# Patient Record
Sex: Male | Born: 1960 | Race: White | Hispanic: No | Marital: Single | Smoking: Current every day smoker
Health system: Southern US, Community
[De-identification: ages and names within clinical notes are randomized; demographics above are authoritative.]

## PROBLEM LIST (undated history)

## (undated) DIAGNOSIS — Z720 Tobacco use: Secondary | ICD-10-CM

## (undated) DIAGNOSIS — F1010 Alcohol abuse, uncomplicated: Secondary | ICD-10-CM

## (undated) DIAGNOSIS — T50901A Poisoning by unspecified drugs, medicaments and biological substances, accidental (unintentional), initial encounter: Secondary | ICD-10-CM

## (undated) DIAGNOSIS — I10 Essential (primary) hypertension: Secondary | ICD-10-CM

## (undated) DIAGNOSIS — J45909 Unspecified asthma, uncomplicated: Secondary | ICD-10-CM

## (undated) DIAGNOSIS — B20 Human immunodeficiency virus [HIV] disease: Secondary | ICD-10-CM

## (undated) DIAGNOSIS — K852 Alcohol induced acute pancreatitis without necrosis or infection: Secondary | ICD-10-CM

## (undated) DIAGNOSIS — K2970 Gastritis, unspecified, without bleeding: Secondary | ICD-10-CM

## (undated) DIAGNOSIS — Z21 Asymptomatic human immunodeficiency virus [HIV] infection status: Secondary | ICD-10-CM

## (undated) HISTORY — PX: CHOLECYSTECTOMY: SHX55

## (undated) HISTORY — PX: CERVICAL DISC SURGERY: SHX588

## (undated) HISTORY — PX: FRENULECTOMY, UPPER LABIAL: SHX1683

## (undated) HISTORY — PX: APPENDECTOMY: SHX54

---

## 2002-02-14 ENCOUNTER — Encounter: Payer: Self-pay | Admitting: Internal Medicine

## 2002-02-14 ENCOUNTER — Inpatient Hospital Stay (HOSPITAL_COMMUNITY): Admission: EM | Admit: 2002-02-14 | Discharge: 2002-02-15 | Payer: Self-pay | Admitting: *Deleted

## 2013-11-23 ENCOUNTER — Inpatient Hospital Stay (HOSPITAL_COMMUNITY)
Admission: EM | Admit: 2013-11-23 | Discharge: 2013-11-25 | DRG: 440 | Disposition: A | Discharge status: Home / Self Care | Payer: Self-pay | Attending: Internal Medicine | Admitting: Internal Medicine

## 2013-11-23 ENCOUNTER — Encounter (HOSPITAL_COMMUNITY): Payer: Self-pay | Admitting: Emergency Medicine

## 2013-11-23 DIAGNOSIS — K859 Acute pancreatitis without necrosis or infection, unspecified: Principal | ICD-10-CM | POA: Diagnosis present

## 2013-11-23 DIAGNOSIS — E876 Hypokalemia: Secondary | ICD-10-CM | POA: Diagnosis present

## 2013-11-23 DIAGNOSIS — Z23 Encounter for immunization: Secondary | ICD-10-CM

## 2013-11-23 DIAGNOSIS — K2920 Alcoholic gastritis without bleeding: Secondary | ICD-10-CM | POA: Diagnosis present

## 2013-11-23 DIAGNOSIS — K861 Other chronic pancreatitis: Secondary | ICD-10-CM | POA: Diagnosis present

## 2013-11-23 DIAGNOSIS — R109 Unspecified abdominal pain: Secondary | ICD-10-CM | POA: Diagnosis present

## 2013-11-23 DIAGNOSIS — Z21 Asymptomatic human immunodeficiency virus [HIV] infection status: Secondary | ICD-10-CM | POA: Diagnosis present

## 2013-11-23 DIAGNOSIS — F1010 Alcohol abuse, uncomplicated: Secondary | ICD-10-CM | POA: Diagnosis present

## 2013-11-23 DIAGNOSIS — B20 Human immunodeficiency virus [HIV] disease: Secondary | ICD-10-CM | POA: Diagnosis present

## 2013-11-23 DIAGNOSIS — F1020 Alcohol dependence, uncomplicated: Secondary | ICD-10-CM | POA: Diagnosis present

## 2013-11-23 DIAGNOSIS — F17200 Nicotine dependence, unspecified, uncomplicated: Secondary | ICD-10-CM | POA: Diagnosis present

## 2013-11-23 DIAGNOSIS — R4182 Altered mental status, unspecified: Secondary | ICD-10-CM

## 2013-11-23 HISTORY — DX: Alcohol induced acute pancreatitis without necrosis or infection: K85.20

## 2013-11-23 HISTORY — DX: Human immunodeficiency virus (HIV) disease: B20

## 2013-11-23 HISTORY — DX: Alcohol abuse, uncomplicated: F10.10

## 2013-11-23 HISTORY — DX: Asymptomatic human immunodeficiency virus (hiv) infection status: Z21

## 2013-11-23 LAB — CBC WITH DIFFERENTIAL/PLATELET
Basophils Absolute: 0.1 10*3/uL (ref 0.0–0.1)
Basophils Relative: 1 % (ref 0–1)
Eosinophils Absolute: 0.2 10*3/uL (ref 0.0–0.7)
Eosinophils Relative: 4 % (ref 0–5)
HCT: 47.3 % (ref 39.0–52.0)
Hemoglobin: 16.8 g/dL (ref 13.0–17.0)
Lymphocytes Relative: 42 % (ref 12–46)
Lymphs Abs: 2.7 10*3/uL (ref 0.7–4.0)
MCH: 33.3 pg (ref 26.0–34.0)
MCHC: 35.5 g/dL (ref 30.0–36.0)
MCV: 93.7 fL (ref 78.0–100.0)
Monocytes Absolute: 0.7 10*3/uL (ref 0.1–1.0)
Monocytes Relative: 11 % (ref 3–12)
Neutro Abs: 2.7 10*3/uL (ref 1.7–7.7)
Neutrophils Relative %: 42 % — ABNORMAL LOW (ref 43–77)
Platelets: 231 10*3/uL (ref 150–400)
RBC: 5.05 MIL/uL (ref 4.22–5.81)
RDW: 14.8 % (ref 11.5–15.5)
WBC: 6.3 10*3/uL (ref 4.0–10.5)

## 2013-11-23 LAB — COMPREHENSIVE METABOLIC PANEL
ALT: 50 U/L (ref 0–53)
AST: 66 U/L — ABNORMAL HIGH (ref 0–37)
Albumin: 3.9 g/dL (ref 3.5–5.2)
Alkaline Phosphatase: 117 U/L (ref 39–117)
BUN: 5 mg/dL — ABNORMAL LOW (ref 6–23)
CO2: 24 mEq/L (ref 19–32)
Calcium: 8.7 mg/dL (ref 8.4–10.5)
Chloride: 104 mEq/L (ref 96–112)
Creatinine, Ser: 0.67 mg/dL (ref 0.50–1.35)
GFR calc Af Amer: >90 mL/min (ref >90)
GFR calc non Af Amer: >90 mL/min (ref >90)
Glucose, Bld: 98 mg/dL (ref 70–99)
Potassium: 3.4 mEq/L — ABNORMAL LOW (ref 3.5–5.1)
Sodium: 143 mEq/L (ref 135–145)
Total Bilirubin: 0.6 mg/dL (ref 0.3–1.2)
Total Protein: 8.3 g/dL (ref 6.0–8.3)

## 2013-11-23 LAB — LIPASE, BLOOD: Lipase: 95 U/L — ABNORMAL HIGH (ref 11–59)

## 2013-11-23 LAB — ETHANOL: Alcohol, Ethyl (B): 346 mg/dL — ABNORMAL HIGH (ref 0–11)

## 2013-11-23 MED ORDER — ONDANSETRON HCL 4 MG/2ML IJ SOLN
4.0000 mg | Freq: Once | INTRAMUSCULAR | Status: AC
  Administered 2013-11-23: 4 mg via INTRAVENOUS
  Filled 2013-11-23: qty 2

## 2013-11-23 MED ORDER — SODIUM CHLORIDE 0.9 % IV BOLUS (SEPSIS)
1000.0000 mL | Freq: Once | INTRAVENOUS | Status: AC
  Administered 2013-11-23: 1000 mL via INTRAVENOUS

## 2013-11-23 MED ORDER — MORPHINE SULFATE 4 MG/ML IJ SOLN
4.0000 mg | Freq: Once | INTRAMUSCULAR | Status: AC
  Administered 2013-11-23: 4 mg via INTRAVENOUS
  Filled 2013-11-23: qty 1
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
## 2013-11-23 NOTE — ED Notes (Signed)
Pt. arrived with [employer] , family called [employer] to assist in transporting pt. To ER due to ETOH intoxication , respiration unlabored at arrival but unable to answer triage nurse due to alcohol intoxication .

## 2013-11-23 NOTE — ED Notes (Signed)
Two IV attempts made on pt, One in left ac, on in right upper arm.

## 2013-11-23 NOTE — ED Provider Notes (Signed)
MSE was initiated and I personally evaluated the patient and placed orders (if any) at  10:34 PM on November 23, 2013.   Patient given pain medication and Zofran for abdominal pain and nausea. Lipase added to labs.  The patient appears stable so that the remainder of the MSE may be completed by another provider.  Shon Baton, MD 11/23/13 2235

## 2013-11-23 NOTE — ED Notes (Signed)
Pt began cussing and was very agitated with his friend.    Pt's friend Sheppard Coil asked to wait in the waiting room at this time.

## 2013-11-23 NOTE — ED Notes (Addendum)
Pt BS noted to be 102 in Triage.

## 2013-11-23 NOTE — ED Notes (Signed)
Pt. given a urinal but refused assistance of triage nurse , family at bedside .

## 2013-11-23 NOTE — ED Notes (Signed)
Pillow given to pt. for comfort , nurse explained delay / wait time  and process to pt. And family .

## 2013-11-23 NOTE — ED Notes (Signed)
[employer] officer came to speak with pt. and explained that he is getting a bed as soon as possible  , pt. became verbally abusive to staff earlier despite repeated explanation .

## 2013-11-24 ENCOUNTER — Encounter (HOSPITAL_COMMUNITY): Payer: Self-pay | Admitting: Internal Medicine

## 2013-11-24 ENCOUNTER — Emergency Department (HOSPITAL_COMMUNITY): Payer: Self-pay

## 2013-11-24 DIAGNOSIS — Z21 Asymptomatic human immunodeficiency virus [HIV] infection status: Secondary | ICD-10-CM

## 2013-11-24 DIAGNOSIS — R109 Unspecified abdominal pain: Secondary | ICD-10-CM | POA: Diagnosis present

## 2013-11-24 DIAGNOSIS — B20 Human immunodeficiency virus [HIV] disease: Secondary | ICD-10-CM | POA: Diagnosis present

## 2013-11-24 DIAGNOSIS — K861 Other chronic pancreatitis: Secondary | ICD-10-CM | POA: Diagnosis present

## 2013-11-24 DIAGNOSIS — R4182 Altered mental status, unspecified: Secondary | ICD-10-CM

## 2013-11-24 DIAGNOSIS — F1010 Alcohol abuse, uncomplicated: Secondary | ICD-10-CM | POA: Diagnosis present

## 2013-11-24 LAB — LIPID PANEL
Cholesterol: 173 mg/dL (ref 0–200)
LDL Cholesterol: 97 mg/dL (ref 0–99)
Total CHOL/HDL Ratio: 3.1 RATIO
VLDL: 21 mg/dL (ref 0–40)

## 2013-11-24 LAB — RAPID URINE DRUG SCREEN, HOSP PERFORMED
Barbiturates: NOT DETECTED
Benzodiazepines: NOT DETECTED
Cocaine: NOT DETECTED
Opiates: POSITIVE — AB

## 2013-11-24 MED ORDER — SODIUM CHLORIDE 0.9 % IV SOLN
INTRAVENOUS | Status: DC
  Administered 2013-11-24: 22:00:00 via INTRAVENOUS

## 2013-11-24 MED ORDER — HYDROMORPHONE HCL PF 1 MG/ML IJ SOLN
1.0000 mg | Freq: Once | INTRAMUSCULAR | Status: AC
  Administered 2013-11-24: 1 mg via INTRAVENOUS
  Filled 2013-11-24: qty 1

## 2013-11-24 MED ORDER — HYDROMORPHONE HCL PF 1 MG/ML IJ SOLN: 1.0000 mg | INTRAMUSCULAR | Status: DC | PRN

## 2013-11-24 MED ORDER — NICOTINE 21 MG/24HR TD PT24
21.0000 mg | MEDICATED_PATCH | Freq: Every day | TRANSDERMAL | Status: DC
  Administered 2013-11-24 – 2013-11-25 (×2): 21 mg via TRANSDERMAL
  Filled 2013-11-24 (×3): qty 1

## 2013-11-24 MED ORDER — HYDROMORPHONE HCL PF 1 MG/ML IJ SOLN
1.0000 mg | INTRAMUSCULAR | Status: DC | PRN
  Administered 2013-11-24: 1 mg via INTRAVENOUS
  Filled 2013-11-24 (×2): qty 1

## 2013-11-24 MED ORDER — LORAZEPAM 2 MG/ML IJ SOLN
1.0000 mg | Freq: Once | INTRAMUSCULAR | Status: AC
  Administered 2013-11-24: 1 mg via INTRAVENOUS
  Filled 2013-11-24: qty 1

## 2013-11-24 MED ORDER — LORAZEPAM 2 MG/ML IJ SOLN
2.0000 mg | Freq: Once | INTRAMUSCULAR | Status: AC
  Administered 2013-11-24: 2 mg via INTRAVENOUS
  Filled 2013-11-24: qty 1

## 2013-11-24 MED ORDER — SODIUM CHLORIDE 0.9 % IJ SOLN
3.0000 mL | Freq: Two times a day (BID) | INTRAMUSCULAR | Status: DC
  Administered 2013-11-25: 3 mL via INTRAVENOUS

## 2013-11-24 MED ORDER — FOLIC ACID 1 MG PO TABS
1.0000 mg | ORAL_TABLET | Freq: Every day | ORAL | Status: DC
  Administered 2013-11-24 – 2013-11-25 (×2): 1 mg via ORAL
  Filled 2013-11-24 (×2): qty 1

## 2013-11-24 MED ORDER — THIAMINE HCL 100 MG/ML IJ SOLN
Freq: Once | INTRAVENOUS | Status: AC
  Administered 2013-11-24: 09:00:00 via INTRAVENOUS
  Filled 2013-11-24: qty 1000

## 2013-11-24 MED ORDER — LORAZEPAM 2 MG/ML IJ SOLN
1.0000 mg | Freq: Four times a day (QID) | INTRAMUSCULAR | Status: DC | PRN
  Filled 2013-11-24: qty 1

## 2013-11-24 MED ORDER — PNEUMOCOCCAL VAC POLYVALENT 25 MCG/0.5ML IJ INJ
0.5000 mL | INJECTION | INTRAMUSCULAR | Status: AC
  Administered 2013-11-25: 0.5 mL via INTRAMUSCULAR
  Filled 2013-11-24: qty 0.5

## 2013-11-24 MED ORDER — PROMETHAZINE HCL 25 MG/ML IJ SOLN
25.0000 mg | Freq: Once | INTRAMUSCULAR | Status: AC
  Administered 2013-11-24: 25 mg via INTRAMUSCULAR
  Filled 2013-11-24: qty 1

## 2013-11-24 MED ORDER — THIAMINE HCL 100 MG/ML IJ SOLN
100.0000 mg | Freq: Every day | INTRAMUSCULAR | Status: DC
  Filled 2013-11-24 (×2): qty 1

## 2013-11-24 MED ORDER — HYDROMORPHONE HCL PF 1 MG/ML IJ SOLN
1.0000 mg | INTRAMUSCULAR | Status: DC | PRN
  Administered 2013-11-24 – 2013-11-25 (×4): 1 mg via INTRAVENOUS
  Filled 2013-11-24 (×4): qty 1

## 2013-11-24 MED ORDER — ADULT MULTIVITAMIN W/MINERALS CH
1.0000 | ORAL_TABLET | Freq: Every day | ORAL | Status: DC
  Administered 2013-11-24 – 2013-11-25 (×2): 1 via ORAL
  Filled 2013-11-24 (×2): qty 1

## 2013-11-24 MED ORDER — ONDANSETRON HCL 4 MG PO TABS
4.0000 mg | ORAL_TABLET | Freq: Four times a day (QID) | ORAL | Status: DC | PRN
  Filled 2013-11-24: qty 1

## 2013-11-24 MED ORDER — LORAZEPAM 0.5 MG PO TABS
1.0000 mg | ORAL_TABLET | Freq: Four times a day (QID) | ORAL | Status: DC | PRN
  Administered 2013-11-24 – 2013-11-25 (×4): 1 mg via ORAL
  Filled 2013-11-24: qty 2
  Filled 2013-11-24: qty 1
  Filled 2013-11-24 (×2): qty 2

## 2013-11-24 MED ORDER — ONDANSETRON HCL 4 MG/2ML IJ SOLN
4.0000 mg | Freq: Four times a day (QID) | INTRAMUSCULAR | Status: DC | PRN
  Administered 2013-11-24: 4 mg via INTRAVENOUS
  Filled 2013-11-24: qty 2

## 2013-11-24 MED ORDER — LORAZEPAM 2 MG/ML IJ SOLN: 0.0000 mg | Freq: Two times a day (BID) | INTRAMUSCULAR | Status: DC

## 2013-11-24 MED ORDER — ACETAMINOPHEN 325 MG PO TABS
650.0000 mg | ORAL_TABLET | Freq: Four times a day (QID) | ORAL | Status: DC | PRN
  Administered 2013-11-24: 650 mg via ORAL
  Filled 2013-11-24: qty 2

## 2013-11-24 MED ORDER — INFLUENZA VAC SPLIT QUAD 0.5 ML IM SUSP
0.5000 mL | INTRAMUSCULAR | Status: AC
  Administered 2013-11-25: 0.5 mL via INTRAMUSCULAR
  Filled 2013-11-24: qty 0.5

## 2013-11-24 MED ORDER — ONDANSETRON HCL 4 MG/2ML IJ SOLN: 4.0000 mg | Freq: Three times a day (TID) | INTRAMUSCULAR | Status: DC | PRN

## 2013-11-24 MED ORDER — ACETAMINOPHEN 650 MG RE SUPP: 650.0000 mg | Freq: Four times a day (QID) | RECTAL | Status: DC | PRN

## 2013-11-24 MED ORDER — SODIUM CHLORIDE 0.9 % IV SOLN: INTRAVENOUS | Status: DC

## 2013-11-24 MED ORDER — LORAZEPAM 2 MG/ML IJ SOLN
0.0000 mg | Freq: Four times a day (QID) | INTRAMUSCULAR | Status: DC
  Administered 2013-11-24 (×2): 2 mg via INTRAVENOUS
  Filled 2013-11-24 (×3): qty 1

## 2013-11-24 MED ORDER — VITAMIN B-1 100 MG PO TABS
100.0000 mg | ORAL_TABLET | Freq: Every day | ORAL | Status: DC
  Administered 2013-11-24 – 2013-11-25 (×2): 100 mg via ORAL
  Filled 2013-11-24 (×2): qty 1

## 2013-11-24 MED ORDER — PROMETHAZINE HCL 25 MG/ML IJ SOLN
12.5000 mg | Freq: Four times a day (QID) | INTRAMUSCULAR | Status: DC | PRN
  Administered 2013-11-24: 25 mg via INTRAVENOUS
  Administered 2013-11-24: 12.5 mg via INTRAVENOUS
  Administered 2013-11-25: 25 mg via INTRAVENOUS
  Filled 2013-11-24 (×5): qty 1

## 2013-11-24 MED ORDER — ENOXAPARIN SODIUM 40 MG/0.4ML ~~LOC~~ SOLN
40.0000 mg | SUBCUTANEOUS | Status: DC
  Administered 2013-11-24: 40 mg via SUBCUTANEOUS
  Filled 2013-11-24 (×2): qty 0.4

## 2013-11-24 NOTE — ED Notes (Signed)
Spoke with Dr Arthor Captain and notified patient of no IV access and if possible inserting a central line per IV team because PICC Nurse will not be able to insert PICC line today. Also if pain medication could be changed to IM and order phenergan instead of zofran. Stated will call back.

## 2013-11-24 NOTE — ED Notes (Signed)
Gave pt a urinal 

## 2013-11-24 NOTE — ED Notes (Signed)
Patient transported to X-ray 

## 2013-11-24 NOTE — H&P (Signed)
Patient's PCP: No primary provider on file.  Chief Complaint: Abdominal pain  History of Present Illness: Jermaine Deleon is a 52 y.o. Caucasian male with history of alcohol abuse, chronic pancreatitis, and HIV not on any medications who presents with the above complaints.  Patient is a poor historian, keeps stating that he has abdominal pain and "we are not helping him."  Patient reports that he recently lost his job and he started drinking alcohol on 11/18/2013, prior to that he has been sober for one year.  Initially he was sent to [facility], a tele-psychiatry obtained on 11/22/2013 recommended involuntary commitment for stabilization and safety.  Uncertain what happened afterwards but patient was eventually discharged.  He was brought to there is a department with complaints of abdominal pain.  He was noted at times that patient was belligerent and abusive about not having his pain under control.  Hospitalist service was asked to admit the patient for further care and management.  Review of Systems: Limited to only abdominal pain.  Denies any chest pain or shortness of breath.  Past Medical History  Diagnosis Date  . Alcohol abuse   . HIV (human immunodeficiency virus infection)    History reviewed. No pertinent past surgical history. Family History  Problem Relation Age of Onset  . COPD Mother   . Other Father     From being shot.   History   Social History  . Marital Status: Single    Spouse Name: N/A    Number of Children: N/A  . Years of Education: N/A   Occupational History  . Not on file.   Social History Main Topics  . Smoking status: Current Every Day Smoker  . Smokeless tobacco: Not on file  . Alcohol Use: Yes  . Drug Use: Not on file  . Sexual Activity: Not on file   Other Topics Concern  . Not on file   Social History Narrative  . No narrative on file   Allergies: Review of patient's allergies indicates no known allergies.  Home Meds: Prior to  Admission medications   Not on File    Physical Exam: Blood pressure 127/79, pulse 95, resp. rate 14, SpO2 96.00%. General: Awake, Oriented x3, in some distress from abdominal pain. HEENT: EOMI, Moist mucous membranes Neck: Supple CV: S1 and S2 Lungs: Clear to ascultation bilaterally Abdomen: Soft, Nontender, Nondistended, +bowel sounds. Ext: Good pulses. Trace edema. No clubbing or cyanosis noted. Neuro: Cranial Nerves II-XII grossly intact. Has 5/5 motor strength in upper and lower extremities.  Lab results:  Recent Labs  11/23/13 2110  NA 143  K 3.4*  CL 104  CO2 24  GLUCOSE 98  BUN 5*  CREATININE 0.67  CALCIUM 8.7    Recent Labs  11/23/13 2110  AST 66*  ALT 50  ALKPHOS 117  BILITOT 0.6  PROT 8.3  ALBUMIN 3.9    Recent Labs  11/23/13 2235  LIPASE 95*    Recent Labs  11/23/13 2110  WBC 6.3  NEUTROABS 2.7  HGB 16.8  HCT 47.3  MCV 93.7  PLT 231   No results found for this basename: CKTOTAL, CKMB, CKMBINDEX, TROPONINI,  in the last 72 hours No components found with this basename: POCBNP,  No results found for this basename: DDIMER,  in the last 72 hours No results found for this basename: HGBA1C,  in the last 72 hours No results found for this basename: CHOL, HDL, LDLCALC, TRIG, CHOLHDL, LDLDIRECT,  in the last 72  hours No results found for this basename: TSH, T4TOTAL, FREET3, T3FREE, THYROIDAB,  in the last 72 hours No results found for this basename: VITAMINB12, FOLATE, FERRITIN, TIBC, IRON, RETICCTPCT,  in the last 72 hours Imaging results:  Dg Abd Acute W/chest  11/24/2013   CLINICAL DATA:  Acute back and abdominal pain. History of pancreatitis.  EXAM: ACUTE ABDOMEN SERIES (ABDOMEN 2 VIEW & CHEST 1 VIEW)  COMPARISON:  Acute abdominal series October 10, 2013  FINDINGS: Cardiac silhouette is unremarkable. Mildly tortuous aorta. Similar coarsened interstitium in the lung bases with strandy densities in right lung base. No pleural effusions or focal  consolidations. No pneumothorax. ACDF.  Bowel gas pattern is nondilated and nonobstructive. Surgical clips in the right abdomen likely reflect cholecystectomy. Phleboliths in the pelvis. No intra-abdominal mass effect, pathologic calcifications or free air. Soft tissue planes and included osseous structures are nonsuspicious.  IMPRESSION: No acute cardiopulmonary process; bibasilar interstitial prominence could reflect fibrosis with right lung base atelectasis versus scarring.  Nonspecific bowel gas pattern.   Electronically Signed   By: Awilda Metro   On: 11/24/2013 00:31   Assessment & Plan by Problem: Abdominal pain likely due to acute on chronic pancreatitis Supportive management with n.p.o. except medications and ice chips.  Hydrate the patient on IV fluids.  Control with pain medications.  Check lipid panel in the morning to evaluate for any hypertriglyceridemia.  Suspect acute exacerbation of pancreatitis likely related to recent alcohol abuse.  Alcohol abuse Counseled the patient on alcohol cessation.  Uncertain if patient is going through withdrawal as he is tachycardic and confused at times, blood alcohol level was elevated on admission, doubt patient is withdrawing from alcohol already.  Patient seemed oriented during my interview with the patient.  Started the patient on CIWA, with scheduled and as needed Ativan.  With thiamine and folic acid.  Due to patient being a difficult patient as he has had episodes of confusion with being belligerent towards staff, plan for stepdown admission for now as he may need higher level of care with continued withdrawal.  Requested psychiatry consult for further management.  Patient currently denies feeling depressed, suicidal or homicidal.  Tobacco abuse Counseled the patient on smoking cessation.  Nicotine patch prescribed.  HIV Not on any anti-retroviral therapy.  Will need to establish with infectious diseases clinic after  discharge.  Hypokalemia Replace as needed.  Prophylaxis Lovenox.  CODE STATUS Full code.  Disposition Admit the patient to stepdown as inpatient.  Time spent on admission, talking to the patient, and coordinating care was: 50 mins.  Sie Formisano A, MD 11/24/2013, 6:25 AM

## 2013-11-24 NOTE — ED Notes (Signed)
Dr Arthor Captain called back will placed central line if ED Doctor unable to place IV via ultrasound.

## 2013-11-24 NOTE — ED Notes (Signed)
Pt is yelling and cursing - Dr. Hyacinth Meeker at bedside and spoke w/ pt regarding appropriate behavior and requested pt cease yelling, cursing and clenching his fists towards staff. Pt compliant w/ Dr. Garen Lah requests at this time -

## 2013-11-24 NOTE — Progress Notes (Signed)
Pt becoming increasingly confused after medication administration.  Pt attempting to get OOB, inquiring about car, calling out for friends.  Attempted to reorient patient to surroundings and circumstances.  Bed alarm in place r/t fall risk. Nolia Tschantz, [facility]

## 2013-11-24 NOTE — ED Notes (Signed)
Went into room and pt was standing up and had pulled the call bell out of the wall and was trying to find somewhere to plug it back into. Pt then opened the drawer up in the room and stated his liquor bottle was in the drawer, and trying to look on the ground for his lighter. Pt was becoming very paranoid and said there were people outside his door laughing and talking about him.

## 2013-11-24 NOTE — ED Notes (Signed)
IV team multiple attempts with ultrasound. Unable to and stated PICC Nurse has scheduled full for today. Paged Admit Doctor.

## 2013-11-24 NOTE — ED Notes (Signed)
IV team at bedside 

## 2013-11-24 NOTE — ED Notes (Signed)
Paged IV team and spoke with them for IV access.

## 2013-11-24 NOTE — ED Notes (Signed)
Pt reports upper abd pain and lower back pain - pt is belligerent on arrival to department. Pt admits to hx of pancreatitis and has been having heavy alcohol use recently. Pt admits to nausea, no vomiting observed.
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
## 2013-11-24 NOTE — Progress Notes (Signed)
Called for report on pt coming to 2C6. RN caring for patient attending to critical need in ED.  My telephone # was provided for him to give report when events in ED stabilized. Kamerin Grumbine, [facility]
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
## 2013-11-24 NOTE — ED Notes (Signed)
Spoke with admit Doctor and will send patient to bed currently assigned.

## 2013-11-24 NOTE — ED Notes (Signed)
Pt appears to have become confused - pt incidentally pulled his IV out, pt states he is in [place] at home, does not know that he is currently in the hospital and making nonsensical statements, appears restless and experiencing flight of ideas - Dr. Hyacinth Meeker made aware of pt's change in mental status and responded at pt's bedside immediately.

## 2013-11-24 NOTE — ED Notes (Signed)
Pt is alert and complaining of abdominal pain and nausea and informed not time for pain medication and will call for order of phenergan because he states that is all he can take

## 2013-11-24 NOTE — ED Notes (Signed)
Admit Doctor at bedside.  

## 2013-11-24 NOTE — ED Provider Notes (Signed)
Access needed to go to the floor. i was asked to put a central line in, however he is not hypotension or needed central venous pressure monitoring, so I do not feel a central line is warranted. Peripheral US-guided IV placed.  Angiocath insertion Performed by: Dagmar Hait  Consent: Verbal consent obtained. Risks and benefits: risks, benefits and alternatives were discussed Time out: Immediately prior to procedure a "time out" was called to verify the correct patient, procedure, equipment, support staff and site/side marked as required.  Preparation: Patient was prepped and draped in the usual sterile fashion.  Vein Location: L AC  Yes Ultrasound Guided  Gauge: 20  Normal blood return and flush without difficulty Patient tolerance: Patient tolerated the procedure well with no immediate complications.     Dagmar Hait, MD 11/24/13 [phone]

## 2013-11-24 NOTE — ED Notes (Signed)
Pts brothers phone number Duaine Radin- [phone]. Pts friends number Nanine Means [phone]

## 2013-11-24 NOTE — Progress Notes (Signed)
Utilization review completed. Akeem Heppler, RN, BSN. 

## 2013-11-25 LAB — COMPREHENSIVE METABOLIC PANEL
ALT: 59 U/L — ABNORMAL HIGH (ref 0–53)
AST: 65 U/L — ABNORMAL HIGH (ref 0–37)
Albumin: 3.2 g/dL — ABNORMAL LOW (ref 3.5–5.2)
Alkaline Phosphatase: 113 U/L (ref 39–117)
BUN: 8 mg/dL (ref 6–23)
CO2: 24 mEq/L (ref 19–32)
Calcium: 8.1 mg/dL — ABNORMAL LOW (ref 8.4–10.5)
Chloride: 100 mEq/L (ref 96–112)
GFR calc Af Amer: >90 mL/min (ref >90)
GFR calc non Af Amer: >90 mL/min (ref >90)
Glucose, Bld: 50 mg/dL — ABNORMAL LOW (ref 70–99)
Potassium: 3.8 mEq/L (ref 3.7–5.3)
Sodium: 139 mEq/L (ref 137–147)
Total Protein: 6.9 g/dL (ref 6.0–8.3)

## 2013-11-25 LAB — CBC
HCT: 46.6 % (ref 39.0–52.0)
Hemoglobin: 16.2 g/dL (ref 13.0–17.0)
MCH: 33.1 pg (ref 26.0–34.0)
MCV: 95.3 fL (ref 78.0–100.0)
Platelets: 155 10*3/uL (ref 150–400)
RDW: 14.7 % (ref 11.5–15.5)
WBC: 5 10*3/uL (ref 4.0–10.5)

## 2013-11-25 LAB — LIPASE, BLOOD: Lipase: 33 U/L (ref 11–59)

## 2013-11-25 LAB — GLUCOSE, CAPILLARY: Glucose-Capillary: 102 mg/dL — ABNORMAL HIGH (ref 70–99)

## 2013-11-25 MED ORDER — DEXTROSE-NACL 5-0.9 % IV SOLN
INTRAVENOUS | Status: DC
  Administered 2013-11-25: 08:00:00 via INTRAVENOUS

## 2013-11-25 MED ORDER — LORAZEPAM 2 MG/ML IJ SOLN
1.0000 mg | Freq: Once | INTRAMUSCULAR | Status: AC
  Administered 2013-11-25: 1 mg via INTRAVENOUS

## 2013-11-25 NOTE — Progress Notes (Signed)
Pt states he can pay the $3.00 bus fare for transportation back to [place]. CSW printed directions to bus stop and route, along with time of bus arrival (5:06pm). CSW signing off.   Maryclare Labrador, MSW, [facility] Clinical Social Worker [phone]

## 2013-11-25 NOTE — Plan of Care (Signed)
Note AM glucose only 50 - change IVFs to D5NS.  Junious Silk, anp

## 2013-11-25 NOTE — Discharge Summary (Signed)
DISCHARGE SUMMARY  Jermaine Deleon  MR#: [id number]  DOB:04-Jul-1961  Date of Admission: 11/23/2013 Date of Discharge: 11/25/2013  Attending Physician:MCCLUNG,JEFFREY T  Patient's PCP:No primary provider on file.  Consults:  none  Disposition: D/C home   Follow-up Appts:     Follow-up Information   Call [facility]    . (call to scheduel an appointment for ongoing routine medical care)    Contact information:   [address] [place] [address] [phone]      Call [facility]             . (call to arrange an appointiment for care of your HIV )    Contact information:   [address] [place] [address]      Discharge Diagnoses: Acute pancreatitis  Nausea and vomiting  Alcohol abuse/intoxication  HIV   Initial presentation: 52 y.o. male with history of alcohol abuse, chronic pancreatitis, and HIV not on any medications who presented with c/o abdom pain. Patient was a poor historian, stating that he has abdominal pain and "we are not helping him." Patient reported that he recently lost his job and he started drinking alcohol on 11/18/2013;  prior to that he has been sober for one year. Initially he was sent to [facility] where reportedly a tele-psychiatry eval was obtained on 11/22/2013 and at the time recommended involuntary commitment for stabilization and safety. It remains uncertain what happened afterwards but the patient was eventually discharged. He subsequently presented to the [facility] ED with complaints of abdominal pain. It was noted at times that the patient was belligerent and abusive about not having his pain under control.   Hospital Course:  Acute pancreatitis  -Lipase was 95, patient has history of recurrent acute pancreatitis.  -Patient had abdominal pain, nausea and vomiting.  -Aggressive hydration with IV fluids -f/u BMET unremarkable - lipase normalized - pt  able to tolerate regular diet w/o difficulty  -pt belligerent with staff and demanding d/c ASAP - pt is A&O x3    Nausea and vomiting  -Could have been secondary to acute pancreatitis or likely acute alcoholic gastritis.  -Patient consumed about 2 and half gallons of vodka in 3 days.  -Sx much improved/essentially resolved - able to tolerate regular diet    Alcohol abuse/intoxication  -Patient counseled extensively, CIWA protocol utilized while inpatient  -Tx with banana bag, thiamine, folic acid and multivitamins..  -Pt alert and oriented at time of d/c   HIV  -Needs referral to infectious disease clinic but pt not willing to wait long enough to allow assistance with this, therefore have provided pt with clinic name w/ instruction to arrange f/u      Medication List         multivitamin with minerals Tabs tablet  Take 1 tablet by mouth daily.     omeprazole 20 MG capsule  Commonly known as:  PRILOSEC  Take 20 mg by mouth daily.     OVER THE COUNTER MEDICATION  Take 1 tablet by mouth daily as needed (for allergies- equate allergy relief).       Day of Discharge BP 129/80  Pulse 80  Temp(Src) 98.2 F (36.8 C) (Oral)  Resp 16  Ht 5\' 8"  (1.727 m)  Wt 55.3 kg (121 lb 14.6 oz)  BMI 18.54 kg/m2  SpO2 94%  Physical Exam: General: No acute respiratory distress - alert and oriented  Lungs: Clear to auscultation  bilaterally without wheezes or crackles Cardiovascular: Regular rate and rhythm without murmur gallop or rub normal S1 and S2 Abdomen: Nontender, nondistended, soft, bowel sounds positive, no rebound, no ascites, no appreciable mass Extremities: No significant cyanosis, clubbing, or edema bilateral lower extremities  Results for orders placed during the hospital encounter of 11/23/13 (from the past 24 hour(s))  MRSA PCR SCREENING     Status: None   Collection Time    11/24/13  2:05 PM      Result Value Range   MRSA by PCR NEGATIVE  NEGATIVE  COMPREHENSIVE  METABOLIC PANEL     Status: Abnormal   Collection Time    11/25/13  4:10 AM      Result Value Range   Sodium 139  137 - 147 mEq/L   Potassium 3.8  3.7 - 5.3 mEq/L   Chloride 100  96 - 112 mEq/L   CO2 24  19 - 32 mEq/L   Glucose, Bld 50 (*) 70 - 99 mg/dL   BUN 8  6 - 23 mg/dL   Creatinine, Ser 8.41  0.50 - 1.35 mg/dL   Calcium 8.1 (*) 8.4 - 10.5 mg/dL   Total Protein 6.9  6.0 - 8.3 g/dL   Albumin 3.2 (*) 3.5 - 5.2 g/dL   AST 65 (*) 0 - 37 U/L   ALT 59 (*) 0 - 53 U/L   Alkaline Phosphatase 113  39 - 117 U/L   Total Bilirubin 1.0  0.3 - 1.2 mg/dL   GFR calc non Af Amer >90  >90 mL/min   GFR calc Af Amer >90  >90 mL/min  CBC     Status: None   Collection Time    11/25/13  4:10 AM      Result Value Range   WBC 5.0  4.0 - 10.5 K/uL   RBC 4.89  4.22 - 5.81 MIL/uL   Hemoglobin 16.2  13.0 - 17.0 g/dL   HCT 32.4  40.1 - 02.7 %   MCV 95.3  78.0 - 100.0 fL   MCH 33.1  26.0 - 34.0 pg   MCHC 34.8  30.0 - 36.0 g/dL   RDW 25.3  66.4 - 40.3 %   Platelets 155  150 - 400 K/uL  ETHANOL     Status: None   Collection Time    11/25/13  4:10 AM      Result Value Range   Alcohol, Ethyl (B) <11  0 - 11 mg/dL  LIPASE, BLOOD     Status: None   Collection Time    11/25/13  4:10 AM      Result Value Range   Lipase 33  11 - 59 U/L    Time spent in discharge (includes decision making & examination of pt): 30 minutes  11/25/2013, 1:32 PM   Lonia Blood, MD [facility] Office  [phone] Pager [phone]  On-Call/Text Page:      [email or web address]      password [facility]

## 2013-11-25 NOTE — Progress Notes (Signed)
Discharge instructions reviewed with the patient.  D/C medications, diet and follow up appointments also reviewed with patient.  Patient instructed to obtain a  PCP in [place] as soon as possible. Client voices understanding to teaching. Patient to go home via bus.  Bus schedule and instructions given and explained to patient. Patient voices understanding.  Patient ambulatory to door.  Home via bus

## 2014-01-25 ENCOUNTER — Inpatient Hospital Stay (HOSPITAL_COMMUNITY)
Admission: EM | Admit: 2014-01-25 | Discharge: 2014-01-30 | DRG: 438 | Disposition: A | Discharge status: Home / Self Care | Payer: Self-pay | Attending: Internal Medicine | Admitting: Internal Medicine

## 2014-01-25 ENCOUNTER — Encounter (HOSPITAL_COMMUNITY): Payer: Self-pay | Admitting: Emergency Medicine

## 2014-01-25 DIAGNOSIS — F17200 Nicotine dependence, unspecified, uncomplicated: Secondary | ICD-10-CM | POA: Diagnosis present

## 2014-01-25 DIAGNOSIS — F10931 Alcohol use, unspecified with withdrawal delirium: Secondary | ICD-10-CM | POA: Diagnosis present

## 2014-01-25 DIAGNOSIS — F10231 Alcohol dependence with withdrawal delirium: Secondary | ICD-10-CM | POA: Diagnosis present

## 2014-01-25 DIAGNOSIS — R5383 Other fatigue: Secondary | ICD-10-CM

## 2014-01-25 DIAGNOSIS — T86898 Other complications of other transplanted tissue: Secondary | ICD-10-CM

## 2014-01-25 DIAGNOSIS — IMO0002 Reserved for concepts with insufficient information to code with codable children: Secondary | ICD-10-CM | POA: Diagnosis present

## 2014-01-25 DIAGNOSIS — R4182 Altered mental status, unspecified: Secondary | ICD-10-CM | POA: Diagnosis present

## 2014-01-25 DIAGNOSIS — B20 Human immunodeficiency virus [HIV] disease: Secondary | ICD-10-CM | POA: Diagnosis present

## 2014-01-25 DIAGNOSIS — R51 Headache: Secondary | ICD-10-CM | POA: Diagnosis present

## 2014-01-25 DIAGNOSIS — J189 Pneumonia, unspecified organism: Secondary | ICD-10-CM | POA: Diagnosis present

## 2014-01-25 DIAGNOSIS — M5416 Radiculopathy, lumbar region: Secondary | ICD-10-CM

## 2014-01-25 DIAGNOSIS — K861 Other chronic pancreatitis: Secondary | ICD-10-CM | POA: Diagnosis present

## 2014-01-25 DIAGNOSIS — F10229 Alcohol dependence with intoxication, unspecified: Secondary | ICD-10-CM | POA: Diagnosis present

## 2014-01-25 DIAGNOSIS — R112 Nausea with vomiting, unspecified: Secondary | ICD-10-CM | POA: Diagnosis present

## 2014-01-25 DIAGNOSIS — R0902 Hypoxemia: Secondary | ICD-10-CM | POA: Diagnosis present

## 2014-01-25 DIAGNOSIS — K859 Acute pancreatitis without necrosis or infection, unspecified: Principal | ICD-10-CM | POA: Diagnosis present

## 2014-01-25 DIAGNOSIS — M79609 Pain in unspecified limb: Secondary | ICD-10-CM | POA: Diagnosis present

## 2014-01-25 DIAGNOSIS — R262 Difficulty in walking, not elsewhere classified: Secondary | ICD-10-CM | POA: Diagnosis present

## 2014-01-25 DIAGNOSIS — K219 Gastro-esophageal reflux disease without esophagitis: Secondary | ICD-10-CM | POA: Diagnosis present

## 2014-01-25 DIAGNOSIS — F1010 Alcohol abuse, uncomplicated: Secondary | ICD-10-CM

## 2014-01-25 DIAGNOSIS — R1013 Epigastric pain: Secondary | ICD-10-CM | POA: Diagnosis present

## 2014-01-25 DIAGNOSIS — F411 Generalized anxiety disorder: Secondary | ICD-10-CM | POA: Diagnosis present

## 2014-01-25 DIAGNOSIS — R197 Diarrhea, unspecified: Secondary | ICD-10-CM | POA: Diagnosis present

## 2014-01-25 DIAGNOSIS — K852 Alcohol induced acute pancreatitis without necrosis or infection: Secondary | ICD-10-CM | POA: Diagnosis present

## 2014-01-25 DIAGNOSIS — R5381 Other malaise: Secondary | ICD-10-CM | POA: Diagnosis present

## 2014-01-25 DIAGNOSIS — Z9089 Acquired absence of other organs: Secondary | ICD-10-CM

## 2014-01-25 NOTE — ED Notes (Signed)
Per EMS report: pt c/o abd pain.  Pt hx of pancreatitis.  Pt is also going through alcohol withdrawal to which pt has the shakes.  Pt reports the only thing that will help is to drink alcohol but alcohol flares up his pancreatitis.  Pt endorses 3 or 4 liquor drinks. Friend estimates about a fifth.

## 2014-01-26 DIAGNOSIS — K861 Other chronic pancreatitis: Secondary | ICD-10-CM

## 2014-01-26 DIAGNOSIS — F10231 Alcohol dependence with withdrawal delirium: Secondary | ICD-10-CM

## 2014-01-26 DIAGNOSIS — K859 Acute pancreatitis without necrosis or infection, unspecified: Principal | ICD-10-CM | POA: Diagnosis present

## 2014-01-26 DIAGNOSIS — R5383 Other fatigue: Secondary | ICD-10-CM

## 2014-01-26 DIAGNOSIS — K852 Alcohol induced acute pancreatitis without necrosis or infection: Secondary | ICD-10-CM | POA: Diagnosis present

## 2014-01-26 DIAGNOSIS — B20 Human immunodeficiency virus [HIV] disease: Secondary | ICD-10-CM

## 2014-01-26 DIAGNOSIS — F10931 Alcohol use, unspecified with withdrawal delirium: Secondary | ICD-10-CM

## 2014-01-26 DIAGNOSIS — Z21 Asymptomatic human immunodeficiency virus [HIV] infection status: Secondary | ICD-10-CM

## 2014-01-26 LAB — COMPREHENSIVE METABOLIC PANEL
ALBUMIN: 3.8 g/dL (ref 3.5–5.2)
ALK PHOS: 111 U/L (ref 39–117)
ALT: 46 U/L (ref 0–53)
AST: 51 U/L — ABNORMAL HIGH (ref 0–37)
BUN: 4 mg/dL — AB (ref 6–23)
CO2: 22 mEq/L (ref 19–32)
Calcium: 9.9 mg/dL (ref 8.4–10.5)
Chloride: 100 mEq/L (ref 96–112)
Creatinine, Ser: 0.66 mg/dL (ref 0.50–1.35)
GFR calc Af Amer: >90 mL/min (ref >90)
GFR calc non Af Amer: >90 mL/min (ref >90)
Glucose, Bld: 99 mg/dL (ref 70–99)
POTASSIUM: 3.6 meq/L — AB (ref 3.7–5.3)
SODIUM: 142 meq/L (ref 137–147)
TOTAL PROTEIN: 8.6 g/dL — AB (ref 6.0–8.3)
Total Bilirubin: 0.3 mg/dL (ref 0.3–1.2)

## 2014-01-26 LAB — CBC WITH DIFFERENTIAL/PLATELET
BASOS ABS: 0.1 10*3/uL (ref 0.0–0.1)
BASOS PCT: 1 % (ref 0–1)
Eosinophils Absolute: 0.2 10*3/uL (ref 0.0–0.7)
Eosinophils Relative: 3 % (ref 0–5)
HEMATOCRIT: 41.6 % (ref 39.0–52.0)
Hemoglobin: 14.7 g/dL (ref 13.0–17.0)
Lymphocytes Relative: 43 % (ref 12–46)
Lymphs Abs: 2.6 10*3/uL (ref 0.7–4.0)
MCH: 32.9 pg (ref 26.0–34.0)
MCHC: 35.3 g/dL (ref 30.0–36.0)
MCV: 93.1 fL (ref 78.0–100.0)
MONO ABS: 0.6 10*3/uL (ref 0.1–1.0)
Monocytes Relative: 10 % (ref 3–12)
Neutro Abs: 2.6 10*3/uL (ref 1.7–7.7)
Neutrophils Relative %: 44 % (ref 43–77)
Platelets: 246 10*3/uL (ref 150–400)
RBC: 4.47 MIL/uL (ref 4.22–5.81)
RDW: 13.4 % (ref 11.5–15.5)
WBC: 6 10*3/uL (ref 4.0–10.5)

## 2014-01-26 LAB — URINALYSIS, ROUTINE W REFLEX MICROSCOPIC
Bilirubin Urine: NEGATIVE
GLUCOSE, UA: NEGATIVE mg/dL
Ketones, ur: NEGATIVE mg/dL
LEUKOCYTES UA: NEGATIVE
Nitrite: NEGATIVE
PH: 7 (ref 5.0–8.0)
Protein, ur: NEGATIVE mg/dL
Specific Gravity, Urine: 1.005 (ref 1.005–1.030)
Urobilinogen, UA: 0.2 mg/dL (ref 0.0–1.0)

## 2014-01-26 LAB — CBC
HCT: 41 % (ref 39.0–52.0)
Hemoglobin: 14.1 g/dL (ref 13.0–17.0)
MCH: 32.3 pg (ref 26.0–34.0)
MCHC: 34.4 g/dL (ref 30.0–36.0)
MCV: 93.8 fL (ref 78.0–100.0)
Platelets: 240 10*3/uL (ref 150–400)
RBC: 4.37 MIL/uL (ref 4.22–5.81)
RDW: 13.6 % (ref 11.5–15.5)
WBC: 5.3 10*3/uL (ref 4.0–10.5)

## 2014-01-26 LAB — RAPID URINE DRUG SCREEN, HOSP PERFORMED
Amphetamines: NOT DETECTED
BENZODIAZEPINES: NOT DETECTED
Barbiturates: NOT DETECTED
COCAINE: NOT DETECTED
OPIATES: NOT DETECTED
Tetrahydrocannabinol: NOT DETECTED

## 2014-01-26 LAB — CREATININE, SERUM
CREATININE: 0.66 mg/dL (ref 0.50–1.35)
GFR calc non Af Amer: >90 mL/min (ref >90)

## 2014-01-26 LAB — MRSA PCR SCREENING: MRSA by PCR: NEGATIVE

## 2014-01-26 LAB — LIPASE, BLOOD: Lipase: 145 U/L — ABNORMAL HIGH (ref 11–59)

## 2014-01-26 LAB — URINE MICROSCOPIC-ADD ON

## 2014-01-26 LAB — ETHANOL: Alcohol, Ethyl (B): 165 mg/dL — ABNORMAL HIGH (ref 0–11)

## 2014-01-26 MED ORDER — ENOXAPARIN SODIUM 40 MG/0.4ML ~~LOC~~ SOLN
40.0000 mg | SUBCUTANEOUS | Status: DC
  Administered 2014-01-26 – 2014-01-30 (×5): 40 mg via SUBCUTANEOUS
  Filled 2014-01-26 (×5): qty 0.4

## 2014-01-26 MED ORDER — PROMETHAZINE HCL 25 MG/ML IJ SOLN
12.5000 mg | Freq: Once | INTRAMUSCULAR | Status: AC
  Administered 2014-01-26: 12.5 mg via INTRAVENOUS
  Filled 2014-01-26: qty 1

## 2014-01-26 MED ORDER — ALUM & MAG HYDROXIDE-SIMETH 200-200-20 MG/5ML PO SUSP
30.0000 mL | Freq: Four times a day (QID) | ORAL | Status: DC | PRN
  Filled 2014-01-26: qty 30

## 2014-01-26 MED ORDER — SODIUM CHLORIDE 0.9 % IV BOLUS (SEPSIS)
1000.0000 mL | Freq: Once | INTRAVENOUS | Status: AC
  Administered 2014-01-26: 1000 mL via INTRAVENOUS

## 2014-01-26 MED ORDER — ONDANSETRON HCL 4 MG/2ML IJ SOLN
4.0000 mg | Freq: Four times a day (QID) | INTRAMUSCULAR | Status: DC | PRN
  Administered 2014-01-27 – 2014-01-28 (×3): 4 mg via INTRAVENOUS
  Filled 2014-01-26 (×3): qty 2

## 2014-01-26 MED ORDER — LORAZEPAM 2 MG/ML IJ SOLN
1.0000 mg | Freq: Once | INTRAMUSCULAR | Status: AC
  Administered 2014-01-26: 1 mg via INTRAVENOUS
  Filled 2014-01-26: qty 1

## 2014-01-26 MED ORDER — DIPHENHYDRAMINE HCL 25 MG PO CAPS
25.0000 mg | ORAL_CAPSULE | Freq: Three times a day (TID) | ORAL | Status: DC | PRN
  Administered 2014-01-26 – 2014-01-28 (×3): 25 mg via ORAL
  Filled 2014-01-26 (×3): qty 1

## 2014-01-26 MED ORDER — ADULT MULTIVITAMIN W/MINERALS CH
1.0000 | ORAL_TABLET | Freq: Every day | ORAL | Status: DC
  Administered 2014-01-26 – 2014-01-30 (×5): 1 via ORAL
  Filled 2014-01-26 (×5): qty 1

## 2014-01-26 MED ORDER — LORAZEPAM 2 MG/ML IJ SOLN: 0.0000 mg | Freq: Two times a day (BID) | INTRAMUSCULAR | Status: DC

## 2014-01-26 MED ORDER — HYDROMORPHONE HCL PF 2 MG/ML IJ SOLN
2.0000 mg | INTRAMUSCULAR | Status: DC | PRN
  Administered 2014-01-26: 2 mg via INTRAVENOUS
  Filled 2014-01-26: qty 1

## 2014-01-26 MED ORDER — LORAZEPAM 2 MG/ML IJ SOLN
0.0000 mg | Freq: Four times a day (QID) | INTRAMUSCULAR | Status: DC
  Administered 2014-01-26: 2 mg via INTRAVENOUS
  Filled 2014-01-26: qty 1

## 2014-01-26 MED ORDER — HYDROMORPHONE HCL PF 2 MG/ML IJ SOLN
2.0000 mg | Freq: Once | INTRAMUSCULAR | Status: AC
  Administered 2014-01-26: 2 mg via INTRAVENOUS
  Filled 2014-01-26: qty 1

## 2014-01-26 MED ORDER — THIAMINE HCL 100 MG/ML IJ SOLN
Freq: Once | INTRAVENOUS | Status: DC
  Administered 2014-01-26: 07:00:00 via INTRAVENOUS
  Filled 2014-01-26: qty 1000

## 2014-01-26 MED ORDER — TRAZODONE HCL 100 MG PO TABS
100.0000 mg | ORAL_TABLET | Freq: Every day | ORAL | Status: DC
  Administered 2014-01-26 – 2014-01-29 (×3): 100 mg via ORAL
  Filled 2014-01-26 (×6): qty 1

## 2014-01-26 MED ORDER — HYDROMORPHONE HCL PF 1 MG/ML IJ SOLN
1.0000 mg | Freq: Once | INTRAMUSCULAR | Status: AC
  Administered 2014-01-26: 1 mg via INTRAVENOUS
  Filled 2014-01-26: qty 1

## 2014-01-26 MED ORDER — FOLIC ACID 1 MG PO TABS
1.0000 mg | ORAL_TABLET | Freq: Every day | ORAL | Status: DC
  Administered 2014-01-26 – 2014-01-30 (×5): 1 mg via ORAL
  Filled 2014-01-26 (×5): qty 1

## 2014-01-26 MED ORDER — LORAZEPAM 1 MG PO TABS: 1.0000 mg | ORAL_TABLET | Freq: Four times a day (QID) | ORAL | Status: DC | PRN

## 2014-01-26 MED ORDER — LORAZEPAM 2 MG/ML IJ SOLN: 1.0000 mg | Freq: Four times a day (QID) | INTRAMUSCULAR | Status: DC | PRN

## 2014-01-26 MED ORDER — HYDROMORPHONE HCL PF 1 MG/ML IJ SOLN: 1.0000 mg | INTRAMUSCULAR | Status: DC | PRN

## 2014-01-26 MED ORDER — THIAMINE HCL 100 MG/ML IJ SOLN
Freq: Once | INTRAVENOUS | Status: AC
  Administered 2014-01-26: 07:00:00 via INTRAVENOUS
  Filled 2014-01-26: qty 1000

## 2014-01-26 MED ORDER — VITAMIN B-1 100 MG PO TABS
100.0000 mg | ORAL_TABLET | Freq: Every day | ORAL | Status: DC
  Administered 2014-01-26 – 2014-01-30 (×5): 100 mg via ORAL
  Filled 2014-01-26 (×5): qty 1

## 2014-01-26 MED ORDER — ONDANSETRON HCL 4 MG PO TABS: 4.0000 mg | ORAL_TABLET | Freq: Four times a day (QID) | ORAL | Status: DC | PRN

## 2014-01-26 MED ORDER — BUSPIRONE HCL 10 MG PO TABS
10.0000 mg | ORAL_TABLET | Freq: Two times a day (BID) | ORAL | Status: DC
  Administered 2014-01-26: 10 mg via ORAL
  Filled 2014-01-26 (×2): qty 1

## 2014-01-26 MED ORDER — ALBUTEROL SULFATE (2.5 MG/3ML) 0.083% IN NEBU
3.0000 mL | INHALATION_SOLUTION | Freq: Four times a day (QID) | RESPIRATORY_TRACT | Status: DC | PRN
  Administered 2014-01-28: 3 mL via RESPIRATORY_TRACT
  Filled 2014-01-26 (×2): qty 3

## 2014-01-26 MED ORDER — LORAZEPAM 2 MG/ML IJ SOLN
0.0000 mg | INTRAMUSCULAR | Status: DC
  Administered 2014-01-26: 6 mg via INTRAVENOUS
  Filled 2014-01-26: qty 3

## 2014-01-26 MED ORDER — PANTOPRAZOLE SODIUM 40 MG IV SOLR
40.0000 mg | Freq: Two times a day (BID) | INTRAVENOUS | Status: DC
  Administered 2014-01-26 – 2014-01-30 (×9): 40 mg via INTRAVENOUS
  Filled 2014-01-26 (×13): qty 40

## 2014-01-26 MED ORDER — THIAMINE HCL 100 MG/ML IJ SOLN
100.0000 mg | Freq: Every day | INTRAMUSCULAR | Status: DC
  Filled 2014-01-26 (×5): qty 1

## 2014-01-26 MED ORDER — VITAMINS A & D EX OINT
TOPICAL_OINTMENT | CUTANEOUS | Status: AC
  Administered 2014-01-26: 13:00:00
  Filled 2014-01-26: qty 5

## 2014-01-26 MED ORDER — KCL IN DEXTROSE-NACL 20-5-0.45 MEQ/L-%-% IV SOLN
INTRAVENOUS | Status: DC
  Administered 2014-01-26 – 2014-01-27 (×2): via INTRAVENOUS
  Filled 2014-01-26 (×5): qty 1000

## 2014-01-26 MED ORDER — HYDROMORPHONE HCL PF 1 MG/ML IJ SOLN
1.0000 mg | INTRAMUSCULAR | Status: DC | PRN
  Administered 2014-01-26 – 2014-01-28 (×8): 1 mg via INTRAVENOUS
  Filled 2014-01-26 (×8): qty 1

## 2014-01-26 MED ORDER — LORAZEPAM 2 MG/ML IJ SOLN
0.0000 mg | INTRAMUSCULAR | Status: DC
  Administered 2014-01-26 (×2): 4 mg via INTRAVENOUS
  Administered 2014-01-26 (×2): 3 mg via INTRAVENOUS
  Administered 2014-01-26: 2 mg via INTRAVENOUS
  Administered 2014-01-27: 1 mg via INTRAVENOUS
  Administered 2014-01-27: 2 mg via INTRAVENOUS
  Administered 2014-01-27 (×3): 4 mg via INTRAVENOUS
  Administered 2014-01-27 (×3): 2 mg via INTRAVENOUS
  Administered 2014-01-27: 1 mg via INTRAVENOUS
  Administered 2014-01-28 (×5): 2 mg via INTRAVENOUS
  Administered 2014-01-28: 4 mg via INTRAVENOUS
  Administered 2014-01-28 (×2): 2 mg via INTRAVENOUS
  Administered 2014-01-28: 4 mg via INTRAVENOUS
  Administered 2014-01-28 – 2014-01-29 (×3): 2 mg via INTRAVENOUS
  Filled 2014-01-26: qty 2
  Filled 2014-01-26: qty 1
  Filled 2014-01-26 (×2): qty 2
  Filled 2014-01-26 (×4): qty 1
  Filled 2014-01-26: qty 2
  Filled 2014-01-26 (×2): qty 1
  Filled 2014-01-26 (×3): qty 2
  Filled 2014-01-26 (×4): qty 1
  Filled 2014-01-26: qty 2
  Filled 2014-01-26 (×4): qty 1
  Filled 2014-01-26: qty 2
  Filled 2014-01-26: qty 1
  Filled 2014-01-26: qty 2
  Filled 2014-01-26 (×4): qty 1

## 2014-01-26 MED ORDER — LORAZEPAM 2 MG/ML IJ SOLN: 0.0000 mg | INTRAMUSCULAR | Status: DC

## 2014-01-26 NOTE — Progress Notes (Signed)
UR completed. Patient changed to inpatient- requiring CIWA protocol

## 2014-01-26 NOTE — Progress Notes (Signed)
CSW received referral for current substance abuse.  Per RN, pt transferred down to stepdown unit secondary to withdrawals.  Per notes and discussion with RN, pt unable to actively participate in assessment at this time due to withdrawal symptoms/agitation.  CSW to continue to follow and complete full psychosocial assessment when pt appropriate to participate.   CSW provided report to [facility] CSW who will continue to follow.  Loletta SpecterSuzanna Kidd, MSW, LCSW Clinical Social Work [phone]

## 2014-01-26 NOTE — ED Notes (Signed)
Patient is alert and oriented x3.  He is complaining of chronic pain with nausea and vomiting.  Patient has a history of pancreatitis and ETOH.

## 2014-01-26 NOTE — Consult Note (Signed)
Name: Jermaine Deleon MRN: [id number] DOB: 07-13-1961    ADMISSION DATE:  01/25/2014 CONSULTATION DATE:  3/3  REFERRING MD :  [facility] PRIMARY SERVICE: [facility]  CHIEF COMPLAINT:  DT's  BRIEF PATIENT DESCRIPTION:    53 yo WM reported to drink 750 ml of vodka daily for an extended time who presented 3/3 with 24 hours of epigatric pain and no ETOH use x 24 hours. Of note he is reported to be HIV +, [name]   SIGNIFICANT EVENTS / STUDIES:    LINES / TUBES:   CULTURES:   ANTIBIOTICS:   HISTORY OF PRESENT ILLNESS:    53 yo WM reported to drink 750 ml of vodka daily for an extended time who presented 3/3 with 24 hours of epigatric pain and no ETOH use x 24 hours. Of note he is reported to be HIV +, [name]  PAST MEDICAL HISTORY :  Past Medical History  Diagnosis Date  . Alcohol abuse   . HIV (human immunodeficiency virus infection)   . Pancreatitis, alcoholic    Past Surgical History  Procedure Laterality Date  . Frenulectomy, upper labial    . Appendectomy    . Cervical disc surgery     Prior to Admission medications   Medication Sig Start Date End Date Taking? Authorizing Provider  albuterol (PROVENTIL HFA;VENTOLIN HFA) 108 (90 BASE) MCG/ACT inhaler Inhale 2 puffs into the lungs every 6 (six) hours as needed for wheezing or shortness of breath.   Yes Historical Provider, MD  Bismuth Subsalicylate (PEPTO-BISMOL MAX STRENGTH) 525 MG/15ML SUSP Take 15 mLs by mouth every 6 (six) hours as needed (heart burn).   Yes Historical Provider, MD  busPIRone (BUSPAR)  10 MG tablet Take 10 mg by mouth 2 (two) times daily.   Yes Historical Provider, MD  Cyanocobalamin (VITAMIN B-12 CR PO) Take 1 tablet by mouth daily.   Yes Historical Provider, MD  FOLIC ACID PO Take 1 tablet by mouth daily.   Yes Historical Provider, MD  Melatonin 5 MG CAPS Take 5 mg by mouth at bedtime.   Yes Historical Provider, MD  Multiple Vitamin (MULTIVITAMIN WITH MINERALS) TABS tablet Take 1 tablet by mouth daily.   Yes Historical Provider, MD  naproxen sodium (ANAPROX) 220 MG tablet Take 220 mg by mouth 2 (two) times daily as needed (pain).   Yes Historical Provider, MD  tetrahydrozoline-zinc (VISINE-AC) 0.05-0.25 % ophthalmic solution Place 2 drops into both eyes 3 (three) times daily as needed (dry eyes).   Yes Historical Provider, MD  Thiamine Mononitrate (VITAMIN B1 PO) Take 1 tablet by mouth daily.   Yes Historical Provider, MD  traZODone (DESYREL) 100 MG tablet Take 100 mg by mouth at bedtime.   Yes Historical Provider, MD  omeprazole (PRILOSEC) 20 MG capsule Take 20 mg by mouth daily.    Historical Provider, MD   Allergies  Allergen Reactions  . Ondansetron Nausea And Vomiting    PT vomited when given Zofran. Later PT informed staff that he vomits every time he's been given Zofran  FAMILY HISTORY:  Family History  Problem Relation Age of Onset  . COPD Mother   . Other Father     From being shot.   SOCIAL HISTORY:  reports that he has been smoking Cigarettes.  He has a 30 pack-year smoking history. He has never used smokeless tobacco. He reports that he drinks alcohol. He reports that he does not use illicit drugs.  REVIEW OF SYSTEMS:  NA  SUBJECTIVE:   VITAL SIGNS: Temp:  [98.3 F (36.8 C)-98.6 F (37 C)] 98.6 F (37 C) (03/03 1200) Pulse Rate:  [81-95] 81 (03/03 1300) Resp:  [13-20] 14 (03/03 1300) BP: (102-127)/(67-88) 110/88 mmHg (03/03 1300) SpO2:  [87 %-100 %] 97 % (03/03 1300) Weight:  [65.409 kg (144 lb 3.2 oz)-66.6 kg (146 lb 13.2 oz)] 66.6 kg (146  lb 13.2 oz) (03/03 1130) HEMODYNAMICS:   VENTILATOR SETTINGS:   INTAKE / OUTPUT: Intake/Output   None     PHYSICAL EXAMINATION: General:  Sedated and confused but follows commands Neuro:  Dull affect, maex 4 to command HEENT:  No jvd/LAN Cardiovascular:  HSR RRR Lungs:  CTA Abdomen:  Soft +bs Musculoskeletal:  intact Skin:  Multiple tattoos   LABS:  CBC  Recent Labs Lab 01/26/14 0228 01/26/14 0710  WBC 6.0 5.3  HGB 14.7 14.1  HCT 41.6 41.0  PLT 246 240   Coag's No results found for this basename: APTT, INR,  in the last 168 hours BMET  Recent Labs Lab 01/26/14 0105 01/26/14 0710  NA 142  --   K 3.6*  --   CL 100  --   CO2 22  --   BUN 4*  --   CREATININE 0.66 0.66  GLUCOSE 99  --    Electrolytes  Recent Labs Lab 01/26/14 0105  CALCIUM 9.9   Sepsis Markers No results found for this basename: LATICACIDVEN, PROCALCITON, O2SATVEN,  in the last 168 hours ABG No results found for this basename: PHART, PCO2ART, PO2ART,  in the last 168 hours Liver Enzymes  Recent Labs Lab 01/26/14 0105  AST 51*  ALT 46  ALKPHOS 111  BILITOT 0.3  ALBUMIN 3.8   Cardiac Enzymes No results found for this basename: TROPONINI, PROBNP,  in the last 168 hours Glucose No results found for this basename: GLUCAP,  in the last 168 hours  Imaging No results found.   CXR:   ASSESSMENT / PLAN:  PULMONARY A: Resp depression secondary to sedation P:   O2 as needed Titrate benzo's to prevent oversedation Precedex may be an option    CARDIOVASCULAR A: No acute issue P:    RENAL A:  No acute issue P:     GASTROINTESTINAL A: GI protection     Nausea     Pancreatitis  P:    [employer] PPI HEMATOLOGIC A:  No acute issue P:    INFECTIOUS A:  No acute issue P:     ENDOCRINE A:  No acute issue P:     NEUROLOGIC A: AMS , ETOH wd, DT's P:   CIWA protocol May need precedex Thiamine /folic acid  TODAY'S SUMMARY:   53 yo WM reported to  drink 750 ml of vodka daily for an extended time who presented 3/3 with 24 hours of epigatric pain and no ETOH use x 24 hours. Of note he is reported to be HIV +, no treatment x 2 years. He was transferred from med - surg to SDU for DT's and excessive need for benzodiazepines with lethargy.  PCCM consulted to be on board in case airway management is needed due to high sedation needs.   [name] ACNP [name] PCCM Pager [phone] till 3 pm If no answer page [phone] 01/26/2014, 1:36 PM    [name], MD, PhD 01/27/2014, 10:20 AM [facility] [phone] or if no answer [phone]

## 2014-01-26 NOTE — ED Notes (Signed)
Dr. David at bedside. 

## 2014-01-26 NOTE — Progress Notes (Signed)
INITIAL NUTRITION ASSESSMENT  DOCUMENTATION CODES Per approved criteria  -Not Applicable   INTERVENTION:  Once PO diet advances add PO supplement such as Resource Breeze BID, each supplement provides 250 kcal and 9 grams protein   RD to follow nutrition care plan  NUTRITION DIAGNOSIS: Inadequate oral intake related to inability to eat as evidenced by NPO status.   Goal: Patient to meet >/= 90% of estimated nutrition needs  Monitor:  PO diet advancement, I/Os, weight trends, labs  Reason for Assessment: Malnutrition Screening Tool Risk  53 y.o. male  Admitting Dx: Acute alcoholic pancreatitis  ASSESSMENT: 53 yo male h/o hiv not on medications for about 2 years, chronic pancreatitis from etoh abuse comes in with severe epigastric pain for over 24 hours with associated n/v nonbloody and diarrhea. Pt has not had any etoh since Monday night. Drinking a pint of hard liquor a day. Has h/o withdrawal symptoms within 24 hours of last drink including n/v/d/shakes, had seizure once before. Denies any fevers. Pain is epigastric radiates to back. Very anxious. No sob. He does not feel any better with pain meds in ED, still 10/10 pain. Total of dilaudid 2 mg given already. Not chronically on pain meds at home.  Upon dietetic intern visit, patient was agitated and confused. Patient reported that his usual weight was 155-160 lb, however patient was unable to elaborate about recent weight loss. Given history of severe abdominal pain and ETOH abuse, dietetic intern suspects some degree of malnutrition.   Electrolytes hanging.   Dietetic intern unable to complete nutrition focused physical exam as patient was agitated. Dietetic intern noticed mild hollowing around temples and and clavicle region.   Pt transferred to ICU d/t DT's and excessive need for benzodiazepines with lethargy. Will monitor diet advancement and supplement as warranted    Height: Ht Readings from Last 1 Encounters:   01/26/14 5\' 8"  (1.727 m)    Weight: Wt Readings from Last 1 Encounters:  01/26/14 144 lb 3.2 oz (65.409 kg)    Ideal Body Weight: 154 lbs  % Ideal Body Weight: 93%  Wt Readings from Last 10 Encounters:  01/26/14 144 lb 3.2 oz (65.409 kg)  11/25/13 121 lb 14.6 oz (55.3 kg)    Usual Body Weight: 155-160 lb  % Usual Body Weight: 93%  BMI:  Body mass index is 21.93 kg/(m^2).  Estimated Nutritional Needs: Kcal: 1700-1900 Protein: 90-100 grams Fluid: 1.7-1.9 L  Skin: no wounds  Diet Order: NPO  EDUCATION NEEDS: -No education needs identified at this time  No intake or output data in the 24 hours ending 01/26/14 0854  Last BM: PTA  Labs:   Recent Labs Lab 01/26/14 0105 01/26/14 0710  NA 142  --   K 3.6*  --   CL 100  --   CO2 22  --   BUN 4*  --   CREATININE 0.66 0.66  CALCIUM 9.9  --   GLUCOSE 99  --     CBG (last 3)  No results found for this basename: GLUCAP,  in the last 72 hours  Scheduled Meds: . busPIRone  10 mg Oral BID  . enoxaparin (LOVENOX) injection  40 mg Subcutaneous Q24H  . folic acid  1 mg Oral Daily  . LORazepam  0-6 mg Intravenous Q2H  . multivitamin with minerals  1 tablet Oral Daily  . pantoprazole (PROTONIX) IV  40 mg Intravenous BID  . thiamine  100 mg Oral Daily   Or  . thiamine  100  mg Intravenous Daily  . traZODone  100 mg Oral QHS    Continuous Infusions:   Past Medical History  Diagnosis Date  . Alcohol abuse   . HIV (human immunodeficiency virus infection)   . Pancreatitis, alcoholic     Past Surgical History  Procedure Laterality Date  . Frenulectomy, upper labial    . Appendectomy    . Cervical disc surgery      Marlane Mingle, Dietetic Intern Pager: [phone]  Lloyd Huger MS RD LDN Clinical Dietitian [phone]

## 2014-01-26 NOTE — ED Provider Notes (Signed)
CSN: [id number]     Arrival date & time 01/25/14  2349 History   First MD Initiated Contact with Patient 01/26/14 0145     Chief Complaint  Patient presents with  . Abdominal Pain     (Consider location/radiation/quality/duration/timing/severity/associated sxs/prior Treatment) HPI Comments: Patient with a history of chronic pancreatitis, alcohol abuse comes in tonight with the complaint abdominal pain, nausea, without vomiting, diarrhea.  He also is complaining of leg pain, and difficulty walking.  He was seen and evaluated this emergency Department.  February 15, for the same he is very hostile antagonistic and demanding  Patient is a 53 y.o. male presenting with abdominal pain. The history is provided by the patient and a friend.  Abdominal Pain Pain location:  Epigastric and LUQ Pain quality: aching   Pain radiates to:  Epigastric region Pain severity:  Severe Onset quality:  Unable to specify Duration:  4 days Timing:  Constant Progression:  Worsening Chronicity:  Chronic Context: alcohol use and retching   Relieved by:  None tried Worsened by:  Eating, movement and palpation Ineffective treatments:  None tried Associated symptoms: diarrhea, nausea and vomiting   Associated symptoms: no fever and no shortness of breath   Diarrhea:    Quality:  Unable to specify   Timing:  Unable to specify Nausea:    Severity:  Unable to specify   Onset quality:  Unable to specify   Duration:  4 days   Timing:  Intermittent   Progression:  Unable to specify Risk factors: alcohol abuse     Past Medical History  Diagnosis Date  . Alcohol abuse   . HIV (human immunodeficiency virus infection)   . Pancreatitis, alcoholic    Past Surgical History  Procedure Laterality Date  . Frenulectomy, upper labial    . Appendectomy    . Cervical disc surgery     Family History  Problem Relation Age of Onset  . COPD Mother   . Other Father     From being shot.   History  Substance Use  Topics  . Smoking status: Current Every Day Smoker -- 1.00 packs/day for 30 years    Types: Cigarettes  . Smokeless tobacco: Never Used  . Alcohol Use: Yes    Review of Systems  Constitutional: Negative for fever.  Respiratory: Negative for shortness of breath.   Cardiovascular: Negative for leg swelling.  Gastrointestinal: Positive for nausea, vomiting, abdominal pain and diarrhea.  Musculoskeletal: Positive for back pain.  Skin: Positive for pallor.  Psychiatric/Behavioral: Positive for agitation.  All other systems reviewed and are negative.      Allergies  Ondansetron  Home Medications   Current Outpatient Rx  Name  Route  Sig  Dispense  Refill  . albuterol (PROVENTIL HFA;VENTOLIN HFA) 108 (90 BASE) MCG/ACT inhaler   Inhalation   Inhale 2 puffs into the lungs every 6 (six) hours as needed for wheezing or shortness of breath.         . Bismuth Subsalicylate (PEPTO-BISMOL MAX STRENGTH) 525 MG/15ML SUSP   Oral   Take 15 mLs by mouth every 6 (six) hours as needed (heart burn).         . busPIRone (BUSPAR) 10 MG tablet   Oral   Take 10 mg by mouth 2 (two) times daily.         . Cyanocobalamin (VITAMIN B-12 CR PO)   Oral   Take 1 tablet by mouth daily.         Marland Kitchen  FOLIC ACID PO   Oral   Take 1 tablet by mouth daily.         . Melatonin 5 MG CAPS   Oral   Take 5 mg by mouth at bedtime.         . Multiple Vitamin (MULTIVITAMIN WITH MINERALS) TABS tablet   Oral   Take 1 tablet by mouth daily.         . naproxen sodium (ANAPROX) 220 MG tablet   Oral   Take 220 mg by mouth 2 (two) times daily as needed (pain).         Marland Kitchen tetrahydrozoline-zinc (VISINE-AC) 0.05-0.25 % ophthalmic solution   Both Eyes   Place 2 drops into both eyes 3 (three) times daily as needed (dry eyes).         . Thiamine Mononitrate (VITAMIN B1 PO)   Oral   Take 1 tablet by mouth daily.         . traZODone (DESYREL) 100 MG tablet   Oral   Take 100 mg by mouth at  bedtime.         Marland Kitchen omeprazole (PRILOSEC) 20 MG capsule   Oral   Take 20 mg by mouth daily.          BP 127/67  Pulse 95  Temp(Src) 98.3 F (36.8 C) (Oral)  Resp 20  SpO2 100% Physical Exam  Nursing note and vitals reviewed. Constitutional: He is oriented to person, place, and time. He appears distressed.  HENT:  Head: Normocephalic.  Mouth/Throat: Oropharynx is clear and moist.  Eyes: Pupils are equal, round, and reactive to light.  Neck: Normal range of motion.  Cardiovascular: Normal rate and regular rhythm.   Pulmonary/Chest: Effort normal and breath sounds normal.  Abdominal: He exhibits no distension. There is tenderness.  Musculoskeletal: Normal range of motion.       Arms: Lymphadenopathy:    He has no cervical adenopathy.  Neurological: He is alert and oriented to person, place, and time.  Skin: Skin is warm. No rash noted. No erythema.  Psychiatric: His affect is angry and inappropriate. He is agitated and aggressive.    ED Course  Procedures (including critical care time) Labs Review Labs Reviewed  COMPREHENSIVE METABOLIC PANEL - Abnormal; Notable for the following:    Potassium 3.6 (*)    BUN 4 (*)    Total Protein 8.6 (*)    AST 51 (*)    All other components within normal limits  URINALYSIS, ROUTINE W REFLEX MICROSCOPIC - Abnormal; Notable for the following:    Hgb urine dipstick TRACE (*)    All other components within normal limits  LIPASE, BLOOD - Abnormal; Notable for the following:    Lipase 145 (*)    All other components within normal limits  URINE RAPID DRUG SCREEN (HOSP PERFORMED)  URINE MICROSCOPIC-ADD ON  CBC WITH DIFFERENTIAL  CBC WITH DIFFERENTIAL  ETHANOL   Imaging Review No results found.   EKG Interpretation None     Is again, reviewed.  Patient's lipase is elevated, most likely will get for pain control.  At that time his low back pain, radiating to his left leg with paresthesia.  Will need to be addressed as well.  Most  likely, with MRI as he had normal.  T. and L-spine x-rays in the middle of February.  He denies any fall, incontinence, to make me believe he needs it emergently MDM   Final diagnoses:  Pancreatitis of transplanted pancreas  Alcohol  abuse  Lumbar radiculopathy         Arman FilterGail K Pammy Vesey, NP 01/26/14 0505

## 2014-01-26 NOTE — H&P (Signed)
Chief Complaint:  N/v abd pain  HPI: 53 yo male h/o hiv not on medications for about 2 years, chronic pancreatitis from etoh abuse comes in with severe epigastric pain for over 24 hours with associated n/v nonbloody and diarrhea.  Pt has not had any etoh since Monday night.  Drinking a pint of hard liquor a day.  Has h/o withdrawal symptoms within 24 hours of last drink including n/v/d/shakes, had seizure once before.  Denies any fevers.  Pain is epigastric radiates to back.  Very anxious.  No sob.  He does not feel any better with pain meds in ED, still 10/10 pain.  Total of dilaudid 2 mg given already.  Not chronically on pain meds at home.  Review of Systems:  Positive and negative as per HPI otherwise all other systems are negative  Past Medical History: Past Medical History  Diagnosis Date  . Alcohol abuse   . HIV (human immunodeficiency virus infection)   . Pancreatitis, alcoholic    Past Surgical History  Procedure Laterality Date  . Frenulectomy, upper labial    . Appendectomy    . Cervical disc surgery      Medications: Prior to Admission medications   Medication Sig Start Date End Date Taking? Authorizing Provider  albuterol (PROVENTIL HFA;VENTOLIN HFA) 108 (90 BASE) MCG/ACT inhaler Inhale 2 puffs into the lungs every 6 (six) hours as needed for wheezing or shortness of breath.   Yes Historical Provider, MD  Bismuth Subsalicylate (PEPTO-BISMOL MAX STRENGTH) 525 MG/15ML SUSP Take 15 mLs by mouth every 6 (six) hours as needed (heart burn).   Yes Historical Provider, MD  busPIRone (BUSPAR) 10 MG tablet Take 10 mg by mouth 2 (two) times daily.   Yes Historical Provider, MD  Cyanocobalamin (VITAMIN B-12 CR PO) Take 1 tablet by mouth daily.   Yes Historical Provider, MD  FOLIC ACID PO Take 1 tablet by mouth daily.   Yes Historical Provider, MD  Melatonin 5 MG CAPS Take 5 mg by mouth at bedtime.   Yes Historical Provider, MD  Multiple Vitamin (MULTIVITAMIN WITH MINERALS) TABS  tablet Take 1 tablet by mouth daily.   Yes Historical Provider, MD  naproxen sodium (ANAPROX) 220 MG tablet Take 220 mg by mouth 2 (two) times daily as needed (pain).   Yes Historical Provider, MD  tetrahydrozoline-zinc (VISINE-AC) 0.05-0.25 % ophthalmic solution Place 2 drops into both eyes 3 (three) times daily as needed (dry eyes).   Yes Historical Provider, MD  Thiamine Mononitrate (VITAMIN B1 PO) Take 1 tablet by mouth daily.   Yes Historical Provider, MD  traZODone (DESYREL) 100 MG tablet Take 100 mg by mouth at bedtime.   Yes Historical Provider, MD  omeprazole (PRILOSEC) 20 MG capsule Take 20 mg by mouth daily.    Historical Provider, MD    Allergies:   Allergies  Allergen Reactions  . Ondansetron Nausea And Vomiting    PT vomited when given Zofran. Later PT informed staff that he vomits every time he's been given Zofran    Social History:  reports that he has been smoking Cigarettes.  He has a 30 pack-year smoking history. He has never used smokeless tobacco. He reports that he drinks alcohol. He reports that he does not use illicit drugs.  Family History: Family History  Problem Relation Age of Onset  . COPD Mother   . Other Father     From being shot.    Physical Exam: Filed Vitals:   01/25/14 2349  BP: 127/67  Pulse: 95  Temp: 98.3 F (36.8 C)  TempSrc: Oral  Resp: 20  SpO2: 100%   General appearance: alert, cooperative and moderate distress Head: Normocephalic, without obvious abnormality, atraumatic Eyes: negative Nose: Nares normal. Septum midline. Mucosa normal. No drainage or sinus tenderness. Neck: no JVD and supple, symmetrical, trachea midline Lungs: clear to auscultation bilaterally Heart: regular rate and rhythm, S1, S2 normal, no murmur, click, rub or gallop Abdomen: soft ttp epigastric pos bs no r//g nd nonacute abd Extremities: extremities normal, atraumatic, no cyanosis or edema Pulses: 2+ and symmetric Skin: Skin color, texture, turgor  normal. No rashes or lesions Neurologic: Grossly normal    Labs on Admission:   Recent Labs  01/26/14 0105  NA 142  K 3.6*  CL 100  CO2 22  GLUCOSE 99  BUN 4*  CREATININE 0.66  CALCIUM 9.9    Recent Labs  01/26/14 0105  AST 51*  ALT 46  ALKPHOS 111  BILITOT 0.3  PROT 8.6*  ALBUMIN 3.8    Recent Labs  01/26/14 0228  LIPASE 145*    Recent Labs  01/26/14 0228  WBC 6.0  NEUTROABS 2.6  HGB 14.7  HCT 41.6  MCV 93.1  PLT 246   Radiological Exams on Admission: No results found.  Assessment/Plan  53 yo male with acute on chronic pancreatitis from etoh abuse  Principal Problem:   Acute alcoholic pancreatitis-  Give another ivf ns bolus, place on bananna bag.  Keep npo x meds and ice chips.  Iv dilaudid, zofran prn.  Advised to stop drinking, he is aware of this.  Active Problems:   Chronic pancreatitis   HIV (human immunodeficiency virus infection)  outpt f/u   Alcohol abuse  ciwa protocol. Give ativan now.  i think his anxiety and withdrawal is exacerbating his pain.    Pt is interested in etoh rehab when he is better from his pancreatitis issues.  He understands the importance of getting help from his etoh abuse, that this is overall also affecting his compliance with his hiv treatment.  Pt understands also that he cannot be verbally abusive to nursing staff, he has been very biligerent in the ED, but was very calm with me.  i have asked him to please be appropriate with the staff here, i have explained the treatment plan with getting his anxiety and pain under better control, he understands this.  All questions were answered.  FULL code.    DAVID,RACHAL A 01/26/2014, 5:03 AM

## 2014-01-26 NOTE — Progress Notes (Signed)
[facility] Patient [facility] to step down unit in bed.

## 2014-01-26 NOTE — ED Provider Notes (Signed)
Medical screening examination/treatment/procedure(s) were performed by non-physician practitioner and as supervising physician I was immediately available for consultation/collaboration.   EKG Interpretation None        Junius ArgyleForrest S Laportia Carley, MD 01/26/14 1909

## 2014-01-26 NOTE — Progress Notes (Addendum)
[facility] PROGRESS NOTE  Jermaine RummageRalph C Deleon ZOX:[id number] DOB: 12-03-1960 DOA: 01/25/2014 PCP: No primary provider on file.  Assessment/Plan  EtOH withdrawal with DTs (possible seizure activity, hallucinations), scores 14-17 and and has received 9mg  of ativan in the last 5 hours and scores are still elevated.  Drinks at least a 5th daily.   -  Transfer to stepdown unit for possible precedex -  Question if he may need intubation in the next few days for withdrawal -  Continue CIWA q2h until ICU -  Continue thiamine, folate -  PCCM aware -  Start keppra 500mg  IV BID for now -  Seizure precautions -  NPO -  Hold buspar and trazodone for now given other sedating medications  Abdominal pain, likely due to acute on chronic pancreatitis vs. PUD/gastritis.  LFTs and lipase wnl.  S/p cholecystectomy -  Decrease dilaudid for now as patient having respiratory suppression from ativan -  Start protonix BID  HIV reported, but no CD4 or HIV VL reported.  Not on ART currently.   -  Patient not currently able to give consent for testing.    Diet:  NPO Access:  PIV IVF:  yes Proph:  lovenox  Code Status: full Family Communication: patient and friend at bedside Disposition Plan: pending improvement in EtOH withdrawal   Consultants:  PCCM  Procedures:  none  Antibiotics:  none   HPI/Subjective:  Patient hallucinating.  Has headache, mild nausea, epigastric pain.      Objective: Filed Vitals:   01/26/14 0514 01/26/14 0601 01/26/14 0819 01/26/14 0926  BP: 123/83 114/75 124/83 116/82  Pulse: 95 92 83 92  Temp:  98.4 F (36.9 C)    TempSrc:  Oral    Resp: 18 18    Height:  5\' 8"  (1.727 m)    Weight:  65.409 kg (144 lb 3.2 oz)    SpO2: 93% 94%     No intake or output data in the 24 hours ending 01/26/14 1025 Filed Weights   01/26/14 0601  Weight: 65.409 kg (144 lb 3.2 oz)    Exam:   General:  CM, No acute distress but occasionally apneic during visit and falling  asleep  HEENT:  NCAT, MMM  Cardiovascular:  RRR, nl S1, S2 no mrg, 2+ pulses, warm extremities  Respiratory:  CTAB, no increased WOB  Abdomen:   NABS, soft, mild TTP epigastrium  MSK:   Normal tone and bulk, no LEE  Neuro:  Grossly intact, LLE with 4/5 strength compared to right, dysmetria.  While asleep/passed out, had jerking of the left arm that appeared to be seizure activity that lasted for about 10-15 seconds and resolved without generalizing.    Data Reviewed: Basic Metabolic Panel:  Recent Labs Lab 01/26/14 0105 01/26/14 0710  NA 142  --   K 3.6*  --   CL 100  --   CO2 22  --   GLUCOSE 99  --   BUN 4*  --   CREATININE 0.66 0.66  CALCIUM 9.9  --    Liver Function Tests:  Recent Labs Lab 01/26/14 0105  AST 51*  ALT 46  ALKPHOS 111  BILITOT 0.3  PROT 8.6*  ALBUMIN 3.8    Recent Labs Lab 01/26/14 0228  LIPASE 145*   No results found for this basename: AMMONIA,  in the last 168 hours CBC:  Recent Labs Lab 01/26/14 0228 01/26/14 0710  WBC 6.0 5.3  NEUTROABS 2.6  --   HGB 14.7 14.1  HCT 41.6 41.0  MCV 93.1 93.8  PLT 246 240   Cardiac Enzymes: No results found for this basename: CKTOTAL, CKMB, CKMBINDEX, TROPONINI,  in the last 168 hours BNP (last 3 results) No results found for this basename: PROBNP,  in the last 8760 hours CBG: No results found for this basename: GLUCAP,  in the last 168 hours  No results found for this or any previous visit (from the past 240 hour(s)).   Studies: No results found.  Scheduled Meds: . enoxaparin (LOVENOX) injection  40 mg Subcutaneous Q24H  . folic acid  1 mg Oral Daily  . LORazepam  0-4 mg Intravenous Q2H  . multivitamin with minerals  1 tablet Oral Daily  . pantoprazole (PROTONIX) IV  40 mg Intravenous BID  . thiamine  100 mg Oral Daily   Or  . thiamine  100 mg Intravenous Daily  . traZODone  100 mg Oral QHS   Continuous Infusions:   Principal Problem:   Acute alcoholic pancreatitis Active  Problems:   Chronic pancreatitis   HIV (human immunodeficiency virus infection)   Alcohol abuse   Pancreatitis    Time spent: 30 min    Tricia Pledger, [facility]  [facility] Pager [phone]. If 7PM-7AM, please contact night-coverage at [email or web address], password [facility] 01/26/2014, 10:25 AM  LOS: 1 day

## 2014-01-27 ENCOUNTER — Encounter (HOSPITAL_COMMUNITY): Payer: Self-pay | Admitting: *Deleted

## 2014-01-27 ENCOUNTER — Inpatient Hospital Stay (HOSPITAL_COMMUNITY): Payer: Self-pay

## 2014-01-27 DIAGNOSIS — F1010 Alcohol abuse, uncomplicated: Secondary | ICD-10-CM

## 2014-01-27 DIAGNOSIS — T86899 Unspecified complication of other transplanted tissue: Secondary | ICD-10-CM

## 2014-01-27 LAB — BLOOD GAS, ARTERIAL
Acid-Base Excess: 1.7 mmol/L (ref 0.0–2.0)
BICARBONATE: 26.1 meq/L — AB (ref 20.0–24.0)
Drawn by: 317871
FIO2: 1 %
O2 SAT: 99.1 %
Patient temperature: 37
TCO2: 23.1 mmol/L (ref 0–100)
pCO2 arterial: 42.1 mmHg (ref 35.0–45.0)
pH, Arterial: 7.409 (ref 7.350–7.450)
pO2, Arterial: 243 mmHg — ABNORMAL HIGH (ref 80.0–100.0)

## 2014-01-27 LAB — RENAL FUNCTION PANEL
Albumin: 2.9 g/dL — ABNORMAL LOW (ref 3.5–5.2)
BUN: 5 mg/dL — ABNORMAL LOW (ref 6–23)
CALCIUM: 8.4 mg/dL (ref 8.4–10.5)
CO2: 24 mEq/L (ref 19–32)
CREATININE: 0.64 mg/dL (ref 0.50–1.35)
Chloride: 103 mEq/L (ref 96–112)
GFR calc Af Amer: >90 mL/min (ref >90)
Glucose, Bld: 86 mg/dL (ref 70–99)
PHOSPHORUS: 3.2 mg/dL (ref 2.3–4.6)
Potassium: 3.6 mEq/L — ABNORMAL LOW (ref 3.7–5.3)
Sodium: 139 mEq/L (ref 137–147)

## 2014-01-27 LAB — CBC
HEMATOCRIT: 38.7 % — AB (ref 39.0–52.0)
Hemoglobin: 13.3 g/dL (ref 13.0–17.0)
MCH: 32.4 pg (ref 26.0–34.0)
MCHC: 34.4 g/dL (ref 30.0–36.0)
MCV: 94.2 fL (ref 78.0–100.0)
PLATELETS: 197 10*3/uL (ref 150–400)
RBC: 4.11 MIL/uL — ABNORMAL LOW (ref 4.22–5.81)
RDW: 13.4 % (ref 11.5–15.5)
WBC: 4.5 10*3/uL (ref 4.0–10.5)

## 2014-01-27 LAB — MAGNESIUM: Magnesium: 1.5 mg/dL (ref 1.5–2.5)

## 2014-01-27 LAB — T-HELPER CELLS (CD4) COUNT (NOT AT ARMC)
CD4 % Helper T Cell: 23 % — ABNORMAL LOW (ref 33–55)
CD4 T CELL ABS: 350 /uL — AB (ref 400–2700)

## 2014-01-27 MED ORDER — PROMETHAZINE HCL 25 MG/ML IJ SOLN
12.5000 mg | Freq: Once | INTRAMUSCULAR | Status: AC
  Administered 2014-01-27: 12.5 mg via INTRAVENOUS
  Filled 2014-01-27: qty 1

## 2014-01-27 MED ORDER — HYDROMORPHONE HCL PF 1 MG/ML IJ SOLN
0.5000 mg | Freq: Once | INTRAMUSCULAR | Status: AC
  Administered 2014-01-27: 0.5 mg via INTRAVENOUS
  Filled 2014-01-27: qty 1

## 2014-01-27 MED ORDER — LEVOFLOXACIN IN D5W 750 MG/150ML IV SOLN
750.0000 mg | INTRAVENOUS | Status: DC
  Administered 2014-01-27 – 2014-01-29 (×3): 750 mg via INTRAVENOUS
  Filled 2014-01-27 (×4): qty 150

## 2014-01-27 MED ORDER — OXYCODONE HCL 5 MG PO TABS
5.0000 mg | ORAL_TABLET | ORAL | Status: DC | PRN
  Administered 2014-01-27 – 2014-01-29 (×7): 5 mg via ORAL
  Filled 2014-01-27 (×7): qty 1

## 2014-01-27 MED ORDER — NICOTINE 21 MG/24HR TD PT24
21.0000 mg | MEDICATED_PATCH | Freq: Every day | TRANSDERMAL | Status: DC
  Administered 2014-01-27 – 2014-01-29 (×3): 21 mg via TRANSDERMAL
  Filled 2014-01-27 (×4): qty 1

## 2014-01-27 MED ORDER — SODIUM CHLORIDE 0.9 % IV SOLN
INTRAVENOUS | Status: DC
  Administered 2014-01-27 – 2014-01-28 (×3): via INTRAVENOUS

## 2014-01-27 NOTE — Progress Notes (Addendum)
[facility] PROGRESS NOTE  Rudi RummageRalph C Cargo ZOX:[id number] DOB: [birth year] DOA: 01/25/2014 PCP: No primary provider on file.  Assessment/Plan: 53 y/o male with PMH of GERD, chronic alcoholic pancreatitis is admitted with abdominal pain, pancreatitis, etoh withdrawals   1. Alcoholism, mild ETOH withdrawals no DTs; etoh intoxicated on admission  -no hallucinations today; clinically better; cont CIWA benzo  2. Chronic pancreatitis likely etoh related;  -cont IVF, diet if tolerated today; check abdominal US;   3. Substance abuse; recommended to stop using  -nicotine patch, ciwa as above   4. H/O HIV; check CD 4  5. Probable pneumonia; started IV atx;  -stop smoking   Code Status: full Family Communication: d/w patient, offered to d/w family he declined  (indicate person spoken with, relationship, and if by phone, the number) Disposition Plan: home 24-48 hours    Consultants:  PCCM  Procedures:  none  Antibiotics:  none (indicate start date, and stop date if known)  HPI/Subjective: alert  Objective: Filed Vitals:   01/27/14 0800  BP: 114/75  Pulse: 67  Temp:   Resp: 16    Intake/Output Summary (Last 24 hours) at 01/27/14 0826 Last data filed at 01/27/14 0745  Gross per 24 hour  Intake  927.5 ml  Output   1150 ml  Net -222.5 ml   Filed Weights   01/26/14 0601 01/26/14 1130  Weight: 65.409 kg (144 lb 3.2 oz) 66.6 kg (146 lb 13.2 oz)    Exam:   General:  alert  Cardiovascular: s1,s2 rrr  Respiratory: CTA BL  Abdomen: soft, mild tender R side; no rebound   Musculoskeletal: no LE edema   Data Reviewed: Basic Metabolic Panel:  Recent Labs Lab 01/26/14 0105 01/26/14 0710 01/27/14 0553  NA 142  --  139  K 3.6*  --  3.6*  CL 100  --  103  CO2 22  --  24  GLUCOSE 99  --  86  BUN 4*  --  5*  CREATININE 0.66 0.66 0.64  CALCIUM 9.9  --  8.4  MG  --   --  1.5  PHOS  --   --  3.2   Liver Function Tests:  Recent Labs Lab 01/26/14 0105  01/27/14 0553  AST 51*  --   ALT 46  --   ALKPHOS 111  --   BILITOT 0.3  --   PROT 8.6*  --   ALBUMIN 3.8 2.9*    Recent Labs Lab 01/26/14 0228  LIPASE 145*   No results found for this basename: AMMONIA,  in the last 168 hours CBC:  Recent Labs Lab 01/26/14 0228 01/26/14 0710 01/27/14 0553  WBC 6.0 5.3 4.5  NEUTROABS 2.6  --   --   HGB 14.7 14.1 13.3  HCT 41.6 41.0 38.7*  MCV 93.1 93.8 94.2  PLT 246 240 197   Cardiac Enzymes: No results found for this basename: CKTOTAL, CKMB, CKMBINDEX, TROPONINI,  in the last 168 hours BNP (last 3 results) No results found for this basename: PROBNP,  in the last 8760 hours CBG: No results found for this basename: GLUCAP,  in the last 168 hours  Recent Results (from the past 240 hour(s))  MRSA PCR SCREENING     Status: None   Collection Time    01/26/14 11:33 AM      Result Value Ref Range Status   MRSA by PCR NEGATIVE  NEGATIVE Final   Comment:  The GeneXpert MRSA Assay (FDA     approved for NASAL specimens     only), is one component of a     comprehensive MRSA colonization     surveillance program. It is not     intended to diagnose MRSA     infection nor to guide or     monitor treatment for     MRSA infections.     Studies: No results found.  Scheduled Meds: . enoxaparin (LOVENOX) injection  40 mg Subcutaneous Q24H  . folic acid  1 mg Oral Daily  . LORazepam  0-4 mg Intravenous Q2H  . multivitamin with minerals  1 tablet Oral Daily  . pantoprazole (PROTONIX) IV  40 mg Intravenous BID  . thiamine  100 mg Oral Daily   Or  . thiamine  100 mg Intravenous Daily  . traZODone  100 mg Oral QHS   Continuous Infusions: . dextrose 5 % and 0.45 % NaCl with KCl 20 mEq/L 75 mL/hr at 01/27/14 0756    Principal Problem:   Acute alcoholic pancreatitis Active Problems:   Chronic pancreatitis   HIV (human immunodeficiency virus infection)   Alcohol abuse   Pancreatitis   Lethargic   Alcohol withdrawal  delirium   Human immunodeficiency virus (HIV) disease    Time spent: >35 minutes     Esperanza Sheets  [facility] Pager [phone]. If 7PM-7AM, please contact night-coverage at [email or web address], password [email or web address] 01/27/2014, 8:26 AM  LOS: 2 days

## 2014-01-27 NOTE — Progress Notes (Signed)
Name: Jermaine Deleon MRN: [id number] DOB: 08-16-1961    ADMISSION DATE:  01/25/2014 CONSULTATION DATE:  3/3  REFERRING MD :  [facility] PRIMARY SERVICE: [facility]  CHIEF COMPLAINT:  DT's  BRIEF PATIENT DESCRIPTION:    53 yo WM reported to drink 750 ml of vodka daily for an extended time who presented 3/3 with 24 hours of epigatric pain and no ETOH use x 24 hours. Of note he is reported to be HIV +, [name]   SIGNIFICANT EVENTS / STUDIES:    LINES / TUBES:   CULTURES:   ANTIBIOTICS:   HISTORY OF PRESENT ILLNESS:    53 yo WM reported to drink 750 ml of vodka daily for an extended time who presented 3/3 with 24 hours of epigatric pain and no ETOH use x 24 hours. Of note he is reported to be HIV +, [name]  SUBJECTIVE:  Did not require precedex, has received ativan pushes based on CIWA scoring  VITAL SIGNS: Temp:  [97.8 F (36.6 C)-98.6 F (37 C)] 97.9 F (36.6 C) (03/04 0800) Pulse Rate:  [61-93] 67 (03/04 0800) Resp:  [11-23] 16 (03/04 0800) BP: (102-151)/(63-100) 114/75 mmHg (03/04 0800) SpO2:  [87 %-99 %] 96 % (03/04 0800) Weight:  [66.6 kg (146 lb 13.2 oz)] 66.6 kg (146 lb 13.2 oz) (03/03 1130) HEMODYNAMICS:   VENTILATOR SETTINGS:   INTAKE / OUTPUT: Intake/Output     03/03 0701 - 03/04 0700 03/04 0701 - 03/05 0700   P.O. 120    I.V. (mL/kg) 807.5 (12.1)    Total Intake(mL/kg) 927.5 (13.9)    Urine (mL/kg/hr) 850 (0.5) 300 (1.6)   Total Output 850 300   Net +77.5 -300          PHYSICAL EXAMINATION: General:  More awake, confused but  follows commands Neuro:  Dull affect, mae x 4 to command HEENT:  No jvd/LAN Cardiovascular:  HSR RRR Lungs:  CTA Abdomen:  Soft +bs Musculoskeletal:  intact Skin:  Multiple tattoos   LABS:  CBC  Recent Labs Lab 01/26/14 0228 01/26/14 0710 01/27/14 0553  WBC 6.0 5.3 4.5  HGB 14.7 14.1 13.3  HCT 41.6 41.0 38.7*  PLT 246 240 197   Coag's No results found for this basename: APTT, INR,  in the last 168 hours BMET  Recent Labs Lab 01/26/14 0105 01/26/14 0710 01/27/14 0553  NA 142  --  139  K 3.6*  --  3.6*  CL 100  --  103  CO2 22  --  24  BUN 4*  --  5*  CREATININE 0.66 0.66 0.64  GLUCOSE 99  --  86   Electrolytes  Recent Labs Lab 01/26/14 0105 01/27/14 0553  CALCIUM 9.9 8.4  MG  --  1.5  PHOS  --  3.2   Sepsis Markers No results found for this basename: LATICACIDVEN, PROCALCITON, O2SATVEN,  in the last 168 hours ABG No results found for this basename: PHART, PCO2ART, PO2ART,  in the last 168 hours Liver Enzymes  Recent Labs Lab 01/26/14 0105  01/27/14 0553  AST 51*  --   ALT 46  --   ALKPHOS 111  --   BILITOT 0.3  --   ALBUMIN 3.8 2.9*   Cardiac Enzymes No results found for this basename: TROPONINI, PROBNP,  in the last 168 hours Glucose No results found for this basename: GLUCAP,  in the last 168 hours  Imaging Dg Chest 1 View  01/27/2014   CLINICAL DATA:  Shortness of breath and chest pain.  EXAM: CHEST - 1 VIEW  COMPARISON:  Chest x-ray 01/01/2014.  FINDINGS: Patchy multifocal lower lung opacities may reflect areas of atelectasis and/or consolidation in the lower lobes of the lungs bilaterally (left greater than right). No definite pleural effusions. No evidence of pulmonary edema. Emphysematous changes are again noted in the lungs bilaterally, particularly in the right upper lobe. Heart size is normal. Mediastinal contours are unremarkable. Atherosclerosis in the thoracic aorta.  IMPRESSION: 1. Worsening bilateral lower lung opacities stent  associated architectural distortion which may reflect progressive areas of atelectasis and/or consolidation in the lower lobes. 2. Atherosclerosis.   Electronically Signed   By: Trudie Reed M.D.   On: 01/27/2014 09:31       ASSESSMENT / PLAN:  PULMONARY A: Resp depression secondary to sedation P:   O2 as needed Titrate benzo's to prevent oversedation Precedex may be an option - he has not required  CARDIOVASCULAR A: No acute issue P:   RENAL A:  No acute issue P:    GASTROINTESTINAL A: GI protection     Nausea     Pancreatitis  P:    [employer] PPI  HEMATOLOGIC A:  No acute issue P:   INFECTIOUS A:  No acute issue P:     ENDOCRINE A:  No acute issue P:    NEUROLOGIC A: AMS , ETOH wd, DT's P:   CIWA protocol May need precedex but appears to be improving Thiamine /folic acid  TODAY'S SUMMARY:   53 yo WM reported to drink 750 ml of vodka daily for an extended time who presented 3/3 with 24 hours of epigatric pain and no ETOH use x 24 hours. Of note he is reported to be HIV +, [name] 3/4 not on precedex, PCCM will be available PRN.  [name] ACNP [name] Pager [phone] till 3 pm If no answer page [phone] 01/27/2014, 9:45 AM    [name], MD, PhD 01/27/2014, 12:06 PM [facility] [phone] or if no answer [phone]

## 2014-01-27 NOTE — Progress Notes (Signed)
Pt c/o increasing pain and n/v. Pt states zofran does not work. Phenergan does. Called midlevel awaiting call back.

## 2014-01-27 NOTE — Progress Notes (Addendum)
Shift event: RN paged this NP earlier secondary to pt desatting after receiving Dilaudid, low dose Phenergan, and scheduled Ativan for withdrawal sx. RN placed pt on NRB and O2 sats up to 93%. ABG ordered with results of normal pH, normal PCO2, and high PO2. Pt changed back to O2 per [place] and sat probe changed and pt is holding in the high 90s now. RN reports pt has a difficult time with understanding the link between sedative meds and his O2 falling and asked that this NP speak to pt.  S: Says his pain and "shakes" are about equal in intensity. Says he tried to eat earlier which was a mistake and has had worse pain since then. Last ETOH was 01/25/14. He has been hospitalized for his pancreatitis and ETOH withdrawal in the past. This NP inquired about his desire/efforts to get help with ETOH cessation and he said he had already contacted [facility] and had 2 appts scheduled which were cancelled due to the snow we had recently. He says he is sincere about seeking help to stop drinking. He doesn't understand why the "treatment plan" he had at last hospitalization is "not working now" (refering to Ativan and Dilaudid being used). Says he did fine before on same regimen.  O: VS reviewed. Desatted into the 70s after sedation meds before NRB applied. Now, O2 sat normal on 2L O2 per [place]. He is alert and oriented at present. Appropriate in speech and behavior is calm. BP stable, not high. RR 12-15. Tremors noted to hands.  A/P: 1. Pancreatitis-         2. ETOH abuse and withdrawal- Decreased to clear liq diet. Only Zofran.  This NP had a long discussion regarding the above and the relationship between sedation and "breathing" and oxygen saturation. I explained that each hospitalization/acute illness is "different" and may require different treatment plan and what "worked" in the past, may not this time. I explained that if he does become too sedated because he needs more Ativan for withdrawal, then he may have to go on Precedex  drip and be intubated for airway protection. I also explained that Dilaudid plus Ativan is a lot of sedation and we only want to keep him safe. I offered to increase his Oxycodone and decrease or stop the Dilaudid for safety's sake but he refused. He understands the risks of intubation. We agreed to the following plan: no more Phenergan, Ativan 1mg  instead of 2mg  at next dosing time, will change pain meds/Ativan to separate dosing times.  Discussed ETOH and how this leads to him feeling so bad and having these serious acute medical issues. He seems sincere about seeking help. I believe social work is involved. I'm sure that his HIV diagnosis has psychological implications for his ETOH use.  Will assess again after MN dosing of meds and change plan as needed. He may certainly require intubation and/or precedex gtt and he understands that.  Jimmye NormanKaren Kirby-Graham, NP [facility] Update: Pt has done fine since MN. His O2 sats have remained normal. He does continue to inquire about pain meds but goes to sleep between doses. RN report some confusion, so he certainly may need escalation of care for withdrawal.  KJKG, NP

## 2014-01-27 NOTE — Progress Notes (Signed)
Spoke with Jermaine MccreedyBarbara, friend of patient.  Encouraged her to bring in patient's glasses.  She said she can bring them in tomorrow evening (01/28/14) at the earliest.  Glasses are not present in his belongings.  No other concerns at this time.    Barrie LymeVance, Bain Whichard E RN 8:16 AM 01/27/2014

## 2014-01-27 NOTE — Progress Notes (Signed)
ANTIBIOTIC CONSULT NOTE - INITIAL  Pharmacy Consult for Levaquin Indication: pneumonia  Allergies  Allergen Reactions  . Ondansetron Nausea And Vomiting    PT vomited when given Zofran. Later PT informed staff that he vomits every time he's been given Zofran    Patient Measurements: Height: 5\' 8"  (172.7 cm) Weight: 146 lb 13.2 oz (66.6 kg) IBW/kg (Calculated) : 68.4  Vital Signs: Temp: 97.9 F (36.6 C) (03/04 0800) Temp src: Oral (03/04 0800) BP: 97/48 mmHg (03/04 1000) Pulse Rate: 87 (03/04 1143) Intake/Output from previous day: 03/03 0701 - 03/04 0700 In: 1002.5 [P.O.:120; I.V.:807.5] Out: 850 [Urine:850] Intake/Output from this shift: Total I/O In: 815 [P.O.:400; I.V.:250; Other:165] Out: 875 [Urine:875]  Labs:  Recent Labs  01/26/14 0105 01/26/14 0228 01/26/14 0710 01/27/14 0553  WBC  --  6.0 5.3 4.5  HGB  --  14.7 14.1 13.3  PLT  --  246 240 197  CREATININE 0.66  --  0.66 0.64   Estimated Creatinine Clearance: 102.9 ml/min (by C-G formula based on Cr of 0.64). No results found for this basename: VANCOTROUGH, Leodis BinetVANCOPEAK, VANCORANDOM, GENTTROUGH, GENTPEAK, GENTRANDOM, TOBRATROUGH, TOBRAPEAK, TOBRARND, AMIKACINPEAK, AMIKACINTROU, AMIKACIN,  in the last 72 hours   Microbiology: Recent Results (from the past 720 hour(s))  MRSA PCR SCREENING     Status: None   Collection Time    01/26/14 11:33 AM      Result Value Ref Range Status   MRSA by PCR NEGATIVE  NEGATIVE Final   Comment:            The GeneXpert MRSA Assay (FDA     approved for NASAL specimens     only), is one component of a     comprehensive MRSA colonization     surveillance program. It is not     intended to diagnose MRSA     infection nor to guide or     monitor treatment for     MRSA infections.    Medical History: Past Medical History  Diagnosis Date  . Alcohol abuse   . HIV (human immunodeficiency virus infection)   . Pancreatitis, alcoholic      Assessment: [age] yoM with PMH of  GERD, chronic alcoholic pancreatitis is admitted with abdominal pain, pancreatitis, ETOH withdrawals found to have probable pneumonia on CXR.  Starting Levaquin for CAP.  Afebrile, WBC WNL.  SCr 0.64, CrCl>100 ml/min.  Goal of Therapy:  Eradication of infection, doses adjusted per renal clearance  Plan:  Levaquin 750 mg IV q24h.  Clance BollRunyon, Jazzlin Clements 01/27/2014,1:06 PM

## 2014-01-27 NOTE — Progress Notes (Signed)
After giving pt medication. Pt with sats down into the 70's. Pt is still alert and oriented but still c/o of pain and agitation. Midlevel paged awaiting orders.

## 2014-01-28 LAB — RENAL FUNCTION PANEL
Albumin: 3.3 g/dL — ABNORMAL LOW (ref 3.5–5.2)
BUN: 3 mg/dL — ABNORMAL LOW (ref 6–23)
CALCIUM: 9.1 mg/dL (ref 8.4–10.5)
CO2: 26 meq/L (ref 19–32)
Chloride: 104 mEq/L (ref 96–112)
Creatinine, Ser: 0.74 mg/dL (ref 0.50–1.35)
GFR calc non Af Amer: >90 mL/min (ref >90)
Glucose, Bld: 84 mg/dL (ref 70–99)
PHOSPHORUS: 2.7 mg/dL (ref 2.3–4.6)
Potassium: 3.7 mEq/L (ref 3.7–5.3)
SODIUM: 142 meq/L (ref 137–147)

## 2014-01-28 LAB — MAGNESIUM: MAGNESIUM: 1.5 mg/dL (ref 1.5–2.5)

## 2014-01-28 MED ORDER — PROMETHAZINE HCL 25 MG/ML IJ SOLN
25.0000 mg | Freq: Once | INTRAMUSCULAR | Status: AC
  Administered 2014-01-28: 25 mg via INTRAVENOUS
  Filled 2014-01-28: qty 1

## 2014-01-28 MED ORDER — SODIUM CHLORIDE 0.9 % IV SOLN
INTRAVENOUS | Status: DC
  Administered 2014-01-28 – 2014-01-29 (×3): via INTRAVENOUS

## 2014-01-28 NOTE — Progress Notes (Signed)
[facility] PROGRESS NOTE  Jermaine RummageRalph C Deleon QMV:[id number] DOB: July 25, 1961 DOA: 01/25/2014 PCP: No primary provider on file.  Assessment/Plan: 53 y/o male with PMH of GERD, chronic alcoholic pancreatitis is admitted with abdominal pain, pancreatitis, etoh withdrawals   1. Alcoholism, mild ETOH withdrawals no DTs; etoh intoxicated on admission  -afebrile, no tremors, no tachycardia, no hallucinations; clinically better; cont CIWA benzo -TF to RNF  2. Chronic pancreatitis likely etoh related;  -cont IVF, diet if tolerated; abdominal US: Enlarged CBD up to 11 mm is chronic and stable, chronic pancreatitis   3. Substance abuse; recommended to stop using  -nicotine patch, ciwa as above; discontinue dilaudid   4. H/O HIV; check CD 4: 350; patient has appointment with ID in 2 weeks to evaluate for HAART  5. Probable pneumonia; started IV atx;  -stop smoking  -mild hypoxia likely due to respiratory depression on narcotics+benzo; stop dilaudid; d/w patient   Code Status: full Family Communication: d/w patient, offered to d/w family he declined  (indicate person spoken with, relationship, and if by phone, the number) Disposition Plan: home 24-48 hours    Consultants:  PCCM  Procedures:  none  Antibiotics:  none (indicate start date, and stop date if known)  HPI/Subjective: alert  Objective: Filed Vitals:   01/28/14 0800  BP:   Pulse:   Temp: 98.1 F (36.7 C)  Resp:     Intake/Output Summary (Last 24 hours) at 01/28/14 0843 Last data filed at 01/28/14 0659  Gross per 24 hour  Intake   4870 ml  Output   4525 ml  Net    345 ml   Filed Weights   01/26/14 0601 01/26/14 1130  Weight: 65.409 kg (144 lb 3.2 oz) 66.6 kg (146 lb 13.2 oz)    Exam:   General:  alert  Cardiovascular: s1,s2 rrr  Respiratory: CTA BL  Abdomen: soft, mild tender R side; no rebound   Musculoskeletal: no LE edema   Data Reviewed: Basic Metabolic Panel:  Recent Labs Lab  01/26/14 0105 01/26/14 0710 01/27/14 0553 01/28/14 0320  NA 142  --  139 142  K 3.6*  --  3.6* 3.7  CL 100  --  103 104  CO2 22  --  24 26  GLUCOSE 99  --  86 84  BUN 4*  --  5* 3*  CREATININE 0.66 0.66 0.64 0.74  CALCIUM 9.9  --  8.4 9.1  MG  --   --  1.5 1.5  PHOS  --   --  3.2 2.7   Liver Function Tests:  Recent Labs Lab 01/26/14 0105 01/27/14 0553 01/28/14 0320  AST 51*  --   --   ALT 46  --   --   ALKPHOS 111  --   --   BILITOT 0.3  --   --   PROT 8.6*  --   --   ALBUMIN 3.8 2.9* 3.3*    Recent Labs Lab 01/26/14 0228  LIPASE 145*   No results found for this basename: AMMONIA,  in the last 168 hours CBC:  Recent Labs Lab 01/26/14 0228 01/26/14 0710 01/27/14 0553  WBC 6.0 5.3 4.5  NEUTROABS 2.6  --   --   HGB 14.7 14.1 13.3  HCT 41.6 41.0 38.7*  MCV 93.1 93.8 94.2  PLT 246 240 197   Cardiac Enzymes: No results found for this basename: CKTOTAL, CKMB, CKMBINDEX, TROPONINI,  in the last 168 hours BNP (last 3 results) No results found for  this basename: PROBNP,  in the last 8760 hours CBG: No results found for this basename: GLUCAP,  in the last 168 hours  Recent Results (from the past 240 hour(s))  MRSA PCR SCREENING     Status: None   Collection Time    01/26/14 11:33 AM      Result Value Ref Range Status   MRSA by PCR NEGATIVE  NEGATIVE Final   Comment:            The GeneXpert MRSA Assay (FDA     approved for NASAL specimens     only), is one component of a     comprehensive MRSA colonization     surveillance program. It is not     intended to diagnose MRSA     infection nor to guide or     monitor treatment for     MRSA infections.     Studies: Dg Chest 1 View  01/27/2014   CLINICAL DATA:  Shortness of breath and chest pain.  EXAM: CHEST - 1 VIEW  COMPARISON:  Chest x-ray 01/01/2014.  FINDINGS: Patchy multifocal lower lung opacities may reflect areas of atelectasis and/or consolidation in the lower lobes of the lungs bilaterally (left  greater than right). No definite pleural effusions. No evidence of pulmonary edema. Emphysematous changes are again noted in the lungs bilaterally, particularly in the right upper lobe. Heart size is normal. Mediastinal contours are unremarkable. Atherosclerosis in the thoracic aorta.  IMPRESSION: 1. Worsening bilateral lower lung opacities stent associated architectural distortion which may reflect progressive areas of atelectasis and/or consolidation in the lower lobes. 2. Atherosclerosis.   Electronically Signed   By: Trudie Reed M.D.   On: 01/27/2014 09:31   US Abdomen Complete  01/27/2014   CLINICAL DATA:  53 year old male with pancreatitis. Alcohol abuse. HIV. Initial encounter.  EXAM: ULTRASOUND ABDOMEN COMPLETE  COMPARISON:  CT Abdomen and Pelvis 12/30/2013 and earlier.  FINDINGS: Gallbladder:  Surgically absent.  Common bile duct:  Diameter: 8-11 mm, Stable.  Liver:  No focal lesion identified. Within normal limits in parenchymal echogenicity. No intrahepatic ductal dilatation.  IVC:  No abnormality visualized.  Pancreas:  Coarse calcification better demonstrated on comparisons. No peripancreatic fluid, and underlying pancreatic head echotexture is within normal limits.  Spleen:  Size and appearance within normal limits.  Right Kidney:  Length: 11.5 cm. Echogenicity within normal limits. No mass or hydronephrosis visualized.  Left Kidney:  Length: 11.3 cm. Echogenicity within normal limits. No mass or hydronephrosis visualized.  Abdominal aorta:  No aneurysm visualized.  Atherosclerosis.  Other findings:  No abdominal free fluid.  IMPRESSION: 1. No acute findings in the abdomen. 2. Enlarged CBD up to 11 mm is chronic and stable. 3. Chronic calcific pancreatitis best demonstrated by CT. 4. Interestingly, the moderate hepatic steatosis demonstrated on 12/30/2013 is not evident today.   Electronically Signed   By: Augusto Gamble M.D.   On: 01/27/2014 16:52    Scheduled Meds: . enoxaparin (LOVENOX)  injection  40 mg Subcutaneous Q24H  . folic acid  1 mg Oral Daily  . levofloxacin (LEVAQUIN) IV  750 mg Intravenous Q24H  . LORazepam  0-4 mg Intravenous Q2H  . multivitamin with minerals  1 tablet Oral Daily  . nicotine  21 mg Transdermal Daily  . pantoprazole (PROTONIX) IV  40 mg Intravenous BID  . thiamine  100 mg Oral Daily   Or  . thiamine  100 mg Intravenous Daily  . traZODone  100 mg Oral  QHS   Continuous Infusions: . sodium chloride 125 mL/hr at 01/28/14 0247    Principal Problem:   Acute alcoholic pancreatitis Active Problems:   Chronic pancreatitis   HIV (human immunodeficiency virus infection)   Alcohol abuse   Pancreatitis   Lethargic   Alcohol withdrawal delirium   Human immunodeficiency virus (HIV) disease    Time spent: >35 minutes     Esperanza Sheets  [facility] Pager [phone]. If 7PM-7AM, please contact night-coverage at [email or web address], password [facility] 01/28/2014, 8:43 AM  LOS: 3 days

## 2014-01-28 NOTE — Progress Notes (Signed)
Wasted 1 mg Ativan IV with Dallas BreedingPam West RN

## 2014-01-29 LAB — RENAL FUNCTION PANEL
Albumin: 3.5 g/dL (ref 3.5–5.2)
BUN: 3 mg/dL — AB (ref 6–23)
CALCIUM: 9.6 mg/dL (ref 8.4–10.5)
CHLORIDE: 104 meq/L (ref 96–112)
CO2: 25 meq/L (ref 19–32)
Creatinine, Ser: 0.73 mg/dL (ref 0.50–1.35)
GFR calc Af Amer: >90 mL/min (ref >90)
GLUCOSE: 91 mg/dL (ref 70–99)
Phosphorus: 4.4 mg/dL (ref 2.3–4.6)
Potassium: 3.7 mEq/L (ref 3.7–5.3)
Sodium: 143 mEq/L (ref 137–147)

## 2014-01-29 LAB — MAGNESIUM: Magnesium: 1.5 mg/dL (ref 1.5–2.5)

## 2014-01-29 MED ORDER — LORAZEPAM 1 MG PO TABS
0.0000 mg | ORAL_TABLET | Freq: Four times a day (QID) | ORAL | Status: DC
  Administered 2014-01-29 – 2014-01-30 (×3): 1 mg via ORAL
  Filled 2014-01-29 (×2): qty 1
  Filled 2014-01-29: qty 2

## 2014-01-29 MED ORDER — PROMETHAZINE HCL 25 MG/ML IJ SOLN
25.0000 mg | Freq: Four times a day (QID) | INTRAMUSCULAR | Status: DC | PRN
  Administered 2014-01-29: 25 mg via INTRAVENOUS
  Filled 2014-01-29: qty 1

## 2014-01-29 MED ORDER — LORAZEPAM 1 MG PO TABS: 0.0000 mg | ORAL_TABLET | Freq: Two times a day (BID) | ORAL | Status: DC

## 2014-01-29 MED ORDER — LORAZEPAM 1 MG PO TABS: 0.0000 mg | ORAL_TABLET | Freq: Four times a day (QID) | ORAL | Status: DC

## 2014-01-29 MED ORDER — KETOROLAC TROMETHAMINE 15 MG/ML IJ SOLN
15.0000 mg | Freq: Four times a day (QID) | INTRAMUSCULAR | Status: DC | PRN
  Administered 2014-01-29: 15 mg via INTRAVENOUS
  Filled 2014-01-29: qty 1

## 2014-01-29 NOTE — Progress Notes (Signed)
Clinical Social Work Department BRIEF PSYCHOSOCIAL ASSESSMENT 01/29/2014  Patient:  Jermaine Deleon, Jermaine Deleon     Account Number:  [id number]     Admit date:  01/25/2014  Clinical Social Worker:  Earlie Server  Date/Time:  01/29/2014 01:30 PM  Referred by:  Physician  Date Referred:  01/29/2014 Referred for  Substance Abuse   Other Referral:   Interview type:  Patient Other interview type:    PSYCHOSOCIAL DATA Living Status:  ALONE Admitted from facility:   Level of care:   Primary support name:  Louie Casa Primary support relationship to patient:  SIBLING Degree of support available:   Adequate    CURRENT CONCERNS Current Concerns  Substance Abuse   Other Concerns:    SOCIAL WORK ASSESSMENT / PLAN CSW received referral in order to complete substance abuse assessment. CSW reviewed chart and met with patient at bedside. CSW introduced myself and explained role.    Patient reports that he was admitted to the hospital for medical concerns but reports that he was consuming large amounts of alcohol as well. Patient states he has been withdrawing in the hospital but feels that symptoms are improving except for headache and feeling tired. Patient reports when he has attempted to detox at home that he has vomited and hallucinated. Patient reports he was withdrawing when he was admitted because he had drank any alcohol since Monday.    Patient agreeable to complete SBIRT and due to high score, CSW spoke with patient re: treatment at [place]. Patient lives in [place] and reports he has already researched options [facility]. CSW inquired about triggers to use and if patient felt he could remain sober if he returned home. Patient reports supportive friends and that he could "work the program." Patient reports he has tried treatment in the past but cannot remember where he has been. CSW explained all treatment options from [place] meetings, outpatient, intensive outpatient (IOP), and residential  treatment. Patient reports he is considering IOP at [facility] but has not made any final decisions. CSW encouraged IOP and possibly even residential treatment.    Patient reports he is tired but does not feel that residential treatment is needed. Patient feels that he is strong and that outpatient or IOP could keep him sober. CSW provided information for residential treatment such as [facility], [facility], or [facility] in case patient changes his mind and explained waiting list process.    CSW will continue to follow and will support throughout hospital stay.   Assessment/plan status:  Referral to [employer] Other assessment/ plan:   SBIRT   Information/referral to community resources:   SA treatment options    PATIENT'S/FAMILY'S RESPONSE TO PLAN OF CARE: Patient alert but somewhat disengaged throughout assessment. Patient has flat affect and avoids eye contact. Patient easily irritated and appears to take offense when CSW offered alternative treatment options. Patient feels proud of himself that he has already spoken to [facility] and feels that he can remain sober when he returns home. Patient is guarded when discussing triggers or coping skills and reports he just wants to sleep.       [place], [place] [phone]

## 2014-01-29 NOTE — Progress Notes (Signed)
Clinical Social Work  CSW attempted to meet with patient to complete assessment re: substance abuse. Patient reports he is tired and does not want to talk at this time. CSW agreeable to follow up at later time to complete assessment.  [place] Rapids[place]olly Anup Brigham, [place] [phone]

## 2014-01-29 NOTE — Progress Notes (Signed)
[facility] PROGRESS NOTE  Jermaine Deleon [id number] DOB: [birth year] DOA: 01/25/2014 PCP: No primary provider on file.  Assessment/Plan: 53 y/o male with PMH of GERD, chronic alcoholic pancreatitis is admitted with abdominal pain, pancreatitis, etoh withdrawals   1. Alcoholism, mild ETOH withdrawals no DTs; etoh intoxicated on admission  -afebrile, no tremors, no tachycardia, no hallucinations; clinically better; taper CIWA benzo  2. Chronic pancreatitis likely etoh related;  -improved;  abdominal US: Enlarged CBD up to 11 mm is chronic and stable, chronic pancreatitis   3. Substance abuse; recommended to stop using  -nicotine patch, ciwa as above; discontinue dilaudid   4. H/O HIV; check CD 4: 350; patient has appointment with ID in 2 weeks to evaluate for HAART  5. Probable pneumonia; started IV atx;  -stop smoking  -mild hypoxia likely due to respiratory depression on narcotics+benzo; stop dilaudid; d/w patient   possible d/c home 3/7 if stable   Code Status: full Family Communication: d/w patient, offered to d/w family he declined  (indicate person spoken with, relationship, and if by phone, the number) Disposition Plan: home 24-48 hours    Consultants:  PCCM  Procedures:  none  Antibiotics:  none (indicate start date, and stop date if known)  HPI/Subjective: alert  Objective: Filed Vitals:   01/28/14 2208  BP: 131/81  Pulse: 80  Temp: 98 F (36.7 C)  Resp: 18    Intake/Output Summary (Last 24 hours) at 01/29/14 0900 Last data filed at 01/29/14 0600  Gross per 24 hour  Intake 2426.67 ml  Output   4075 ml  Net -1648.33 ml   Filed Weights   01/26/14 0601 01/26/14 1130  Weight: 65.409 kg (144 lb 3.2 oz) 66.6 kg (146 lb 13.2 oz)    Exam:   General:  alert  Cardiovascular: s1,s2 rrr  Respiratory: CTA BL  Abdomen: soft, mild tender R side; no rebound   Musculoskeletal: no LE edema   Data Reviewed: Basic Metabolic Panel:  Recent  Labs Lab 01/26/14 0105 01/26/14 0710 01/27/14 0553 01/28/14 0320 01/29/14 0535  NA 142  --  139 142 143  K 3.6*  --  3.6* 3.7 3.7  CL 100  --  103 104 104  CO2 22  --  24 26 25   GLUCOSE 99  --  86 84 91  BUN 4*  --  5* 3* 3*  CREATININE 0.66 0.66 0.64 0.74 0.73  CALCIUM 9.9  --  8.4 9.1 9.6  MG  --   --  1.5 1.5 1.5  PHOS  --   --  3.2 2.7 4.4   Liver Function Tests:  Recent Labs Lab 01/26/14 0105 01/27/14 0553 01/28/14 0320 01/29/14 0535  AST 51*  --   --   --   ALT 46  --   --   --   ALKPHOS 111  --   --   --   BILITOT 0.3  --   --   --   PROT 8.6*  --   --   --   ALBUMIN 3.8 2.9* 3.3* 3.5    Recent Labs Lab 01/26/14 0228  LIPASE 145*   No results found for this basename: AMMONIA,  in the last 168 hours CBC:  Recent Labs Lab 01/26/14 0228 01/26/14 0710 01/27/14 0553  WBC 6.0 5.3 4.5  NEUTROABS 2.6  --   --   HGB 14.7 14.1 13.3  HCT 41.6 41.0 38.7*  MCV 93.1 93.8 94.2  PLT 246 240 197  Cardiac Enzymes: No results found for this basename: CKTOTAL, CKMB, CKMBINDEX, TROPONINI,  in the last 168 hours BNP (last 3 results) No results found for this basename: PROBNP,  in the last 8760 hours CBG: No results found for this basename: GLUCAP,  in the last 168 hours  Recent Results (from the past 240 hour(s))  MRSA PCR SCREENING     Status: None   Collection Time    01/26/14 11:33 AM      Result Value Ref Range Status   MRSA by PCR NEGATIVE  NEGATIVE Final   Comment:            The GeneXpert MRSA Assay (FDA     approved for NASAL specimens     only), is one component of a     comprehensive MRSA colonization     surveillance program. It is not     intended to diagnose MRSA     infection nor to guide or     monitor treatment for     MRSA infections.     Studies: Dg Chest 1 View  01/27/2014   CLINICAL DATA:  Shortness of breath and chest pain.  EXAM: CHEST - 1 VIEW  COMPARISON:  Chest x-ray 01/01/2014.  FINDINGS: Patchy multifocal lower lung  opacities may reflect areas of atelectasis and/or consolidation in the lower lobes of the lungs bilaterally (left greater than right). No definite pleural effusions. No evidence of pulmonary edema. Emphysematous changes are again noted in the lungs bilaterally, particularly in the right upper lobe. Heart size is normal. Mediastinal contours are unremarkable. Atherosclerosis in the thoracic aorta.  IMPRESSION: 1. Worsening bilateral lower lung opacities stent associated architectural distortion which may reflect progressive areas of atelectasis and/or consolidation in the lower lobes. 2. Atherosclerosis.   Electronically Signed   By: Trudie Reed M.D.   On: 01/27/2014 09:31   US Abdomen Complete  01/27/2014   CLINICAL DATA:  53 year old male with pancreatitis. Alcohol abuse. HIV. Initial encounter.  EXAM: ULTRASOUND ABDOMEN COMPLETE  COMPARISON:  CT Abdomen and Pelvis 12/30/2013 and earlier.  FINDINGS: Gallbladder:  Surgically absent.  Common bile duct:  Diameter: 8-11 mm, Stable.  Liver:  No focal lesion identified. Within normal limits in parenchymal echogenicity. No intrahepatic ductal dilatation.  IVC:  No abnormality visualized.  Pancreas:  Coarse calcification better demonstrated on comparisons. No peripancreatic fluid, and underlying pancreatic head echotexture is within normal limits.  Spleen:  Size and appearance within normal limits.  Right Kidney:  Length: 11.5 cm. Echogenicity within normal limits. No mass or hydronephrosis visualized.  Left Kidney:  Length: 11.3 cm. Echogenicity within normal limits. No mass or hydronephrosis visualized.  Abdominal aorta:  No aneurysm visualized.  Atherosclerosis.  Other findings:  No abdominal free fluid.  IMPRESSION: 1. No acute findings in the abdomen. 2. Enlarged CBD up to 11 mm is chronic and stable. 3. Chronic calcific pancreatitis best demonstrated by CT. 4. Interestingly, the moderate hepatic steatosis demonstrated on 12/30/2013 is not evident today.    Electronically Signed   By: Augusto Gamble M.D.   On: 01/27/2014 16:52    Scheduled Meds: . enoxaparin (LOVENOX) injection  40 mg Subcutaneous Q24H  . folic acid  1 mg Oral Daily  . levofloxacin (LEVAQUIN) IV  750 mg Intravenous Q24H  . LORazepam  0-4 mg Oral Q6H   Followed by  . [START ON 01/31/2014] LORazepam  0-4 mg Oral Q12H  . multivitamin with minerals  1 tablet Oral Daily  . nicotine  21 mg Transdermal Daily  . pantoprazole (PROTONIX) IV  40 mg Intravenous BID  . thiamine  100 mg Oral Daily   Or  . thiamine  100 mg Intravenous Daily  . traZODone  100 mg Oral QHS   Continuous Infusions: . sodium chloride 100 mL/hr at 01/29/14 0803    Principal Problem:   Acute alcoholic pancreatitis Active Problems:   Chronic pancreatitis   HIV (human immunodeficiency virus infection)   Alcohol abuse   Pancreatitis   Lethargic   Alcohol withdrawal delirium   Human immunodeficiency virus (HIV) disease    Time spent: >35 minutes     Esperanza Sheets  [facility] Pager [phone]. If 7PM-7AM, please contact night-coverage at [email or web address], password [facility] 01/29/2014, 9:00 AM  LOS: 4 days

## 2014-01-30 LAB — RENAL FUNCTION PANEL
Albumin: 3 g/dL — ABNORMAL LOW (ref 3.5–5.2)
BUN: 7 mg/dL (ref 6–23)
CHLORIDE: 102 meq/L (ref 96–112)
CO2: 23 meq/L (ref 19–32)
Calcium: 9 mg/dL (ref 8.4–10.5)
Creatinine, Ser: 0.78 mg/dL (ref 0.50–1.35)
GFR calc non Af Amer: >90 mL/min (ref >90)
GLUCOSE: 122 mg/dL — AB (ref 70–99)
PHOSPHORUS: 3 mg/dL (ref 2.3–4.6)
Potassium: 3.6 mEq/L — ABNORMAL LOW (ref 3.7–5.3)
SODIUM: 138 meq/L (ref 137–147)

## 2014-01-30 LAB — MAGNESIUM: MAGNESIUM: 1.5 mg/dL (ref 1.5–2.5)

## 2014-01-30 MED ORDER — OXYCODONE HCL 5 MG PO TABS: 5.0000 mg | ORAL_TABLET | ORAL | Status: DC | PRN

## 2014-01-30 MED ORDER — NICOTINE 21 MG/24HR TD PT24: 21.0000 mg | MEDICATED_PATCH | Freq: Every day | TRANSDERMAL | Status: DC

## 2014-01-30 MED ORDER — LEVOFLOXACIN 750 MG PO TABS: 750.0000 mg | ORAL_TABLET | Freq: Every day | ORAL | Status: DC

## 2014-01-30 NOTE — Discharge Summary (Signed)
Physician Discharge Summary  Jermaine Deleon:[id number] DOB: [birth year] DOA: 01/25/2014  PCP: No primary provider on file.  Admit date: 01/25/2014 Discharge date: 01/30/2014  Time spent: >35 minutes  Recommendations for Outpatient Follow-up:  Follow up with PCP in 1-2 weeks  Discharge Diagnoses:  Principal Problem:   Acute alcoholic pancreatitis Active Problems:   Chronic pancreatitis   HIV (human immunodeficiency virus infection)   Alcohol abuse   Pancreatitis   Lethargic   Alcohol withdrawal delirium   Human immunodeficiency virus (HIV) disease   Discharge Condition: stable   Diet recommendation: heart healthy   Filed Weights   01/26/14 0601 01/26/14 1130  Weight: 65.409 kg (144 lb 3.2 oz) 66.6 kg (146 lb 13.2 oz)    History of present illness:  53 y/o male with PMH of GERD, chronic alcoholic pancreatitis is admitted with abdominal pain, pancreatitis, etoh withdrawals   Hospital Course:  1. Alcoholism, mild ETOH withdrawals; resolved; no DTs; etoh intoxicated on admission  -afebrile, no tremors, no tachycardia, no hallucinations; clinically better;  -consulted on etoh  use 2. Chronic pancreatitis likely etoh related;  -improved; abdominal US: Enlarged CBD up to 11 mm is chronic and stable, chronic pancreatitis  3. Substance abuse; recommended to stop using  -nicotine patch  4. H/O HIV; check CD 4: 350; patient has appointment with ID in 2 weeks to evaluate for HAART  5. Probable pneumonia; improved on IV atx; changed to PO atx  -stop smoking    Procedures:  none (i.e. Studies not automatically included, echos, thoracentesis, etc; not x-rays)  Consultations:  nonen  Discharge Exam: Filed Vitals:   01/30/14 0643  BP: 132/77  Pulse: 76  Temp: 97.9 F (36.6 C)  Resp: 16    General: alert Cardiovascular: s1,s2 rrr Respiratory: CTA BL  Discharge Instructions  Discharge Orders   Future Orders Complete By Expires   Diet - low sodium heart healthy  As  directed    Discharge instructions  As directed    Comments:     Please follow up with primary care doctor in 1-2 weeks   Increase activity slowly  As directed        Medication List         albuterol 108 (90 BASE) MCG/ACT inhaler  Commonly known as:  PROVENTIL HFA;VENTOLIN HFA  Inhale 2 puffs into the lungs every 6 (six) hours as needed for wheezing or shortness of breath.     busPIRone 10 MG tablet  Commonly known as:  BUSPAR  Take 10 mg by mouth 2 (two) times daily.     FOLIC ACID PO  Take 1 tablet by mouth daily.     levofloxacin 750 MG tablet  Commonly known as:  LEVAQUIN  Take 1 tablet (750 mg total) by mouth daily.     Melatonin 5 MG Caps  Take 5 mg by mouth at bedtime.     multivitamin with minerals Tabs tablet  Take 1 tablet by mouth daily.     naproxen sodium 220 MG tablet  Commonly known as:  ANAPROX  Take 220 mg by mouth 2 (two) times daily as needed (pain).     nicotine 21 mg/24hr patch  Commonly known as:  NICODERM CQ - dosed in mg/24 hours  Place 1 patch (21 mg total) onto the skin daily.     omeprazole 20 MG capsule  Commonly known as:  PRILOSEC  Take 20 mg by mouth daily.     oxyCODONE 5 MG immediate release tablet  Commonly known as:  Oxy IR/ROXICODONE  Take 1 tablet (5 mg total) by mouth every 4 (four) hours as needed for severe pain.     PEPTO-BISMOL MAX STRENGTH 525 MG/15ML Susp  Generic drug:  Bismuth Subsalicylate  Take 15 mLs by mouth every 6 (six) hours as needed (heart burn).     tetrahydrozoline-zinc 0.05-0.25 % ophthalmic solution  Commonly known as:  VISINE-AC  Place 2 drops into both eyes 3 (three) times daily as needed (dry eyes).     traZODone 100 MG tablet  Commonly known as:  DESYREL  Take 100 mg by mouth at bedtime.     VITAMIN B-12 CR PO  Take 1 tablet by mouth daily.     VITAMIN B1 PO  Take 1 tablet by mouth daily.       Allergies  Allergen Reactions  . Ondansetron Nausea And Vomiting    PT vomited when given  Zofran. Later PT informed staff that he vomits every time he's been given Zofran       Follow-up Information   Follow up with [facility]     In 1 week.   Contact information:   [address] [place] [address] [phone]       The results of significant diagnostics from this hospitalization (including imaging, microbiology, ancillary and laboratory) are listed below for reference.    Significant Diagnostic Studies: Dg Chest 1 View  01/27/2014   CLINICAL DATA:  Shortness of breath and chest pain.  EXAM: CHEST - 1 VIEW  COMPARISON:  Chest x-ray 01/01/2014.  FINDINGS: Patchy multifocal lower lung opacities may reflect areas of atelectasis and/or consolidation in the lower lobes of the lungs bilaterally (left greater than right). No definite pleural effusions. No evidence of pulmonary edema. Emphysematous changes are again noted in the lungs bilaterally, particularly in the right upper lobe. Heart size is normal. Mediastinal contours are unremarkable. Atherosclerosis in the thoracic aorta.  IMPRESSION: 1. Worsening bilateral lower lung opacities stent associated architectural distortion which may reflect progressive areas of atelectasis and/or consolidation in the lower lobes. 2. Atherosclerosis.   Electronically Signed   By: Trudie Reed M.D.   On: 01/27/2014 09:31   US Abdomen Complete  01/27/2014   CLINICAL DATA:  53 year old male with pancreatitis. Alcohol abuse. HIV. Initial encounter.  EXAM: ULTRASOUND ABDOMEN COMPLETE  COMPARISON:  CT Abdomen and Pelvis 12/30/2013 and earlier.  FINDINGS: Gallbladder:  Surgically absent.  Common bile duct:  Diameter: 8-11 mm, Stable.  Liver:  No focal lesion identified. Within normal limits in parenchymal echogenicity. No intrahepatic ductal dilatation.  IVC:  No abnormality visualized.  Pancreas:  Coarse calcification better demonstrated on comparisons. No peripancreatic fluid, and underlying pancreatic head  echotexture is within normal limits.  Spleen:  Size and appearance within normal limits.  Right Kidney:  Length: 11.5 cm. Echogenicity within normal limits. No mass or hydronephrosis visualized.  Left Kidney:  Length: 11.3 cm. Echogenicity within normal limits. No mass or hydronephrosis visualized.  Abdominal aorta:  No aneurysm visualized.  Atherosclerosis.  Other findings:  No abdominal free fluid.  IMPRESSION: 1. No acute findings in the abdomen. 2. Enlarged CBD up to 11 mm is chronic and stable. 3. Chronic calcific pancreatitis best demonstrated by CT. 4. Interestingly, the moderate hepatic steatosis demonstrated on 12/30/2013 is not evident today.   Electronically Signed   By: Augusto Gamble M.D.   On: 01/27/2014 16:52    Microbiology: Recent Results (from the past 240  hour(s))  MRSA PCR SCREENING     Status: None   Collection Time    01/26/14 11:33 AM      Result Value Ref Range Status   MRSA by PCR NEGATIVE  NEGATIVE Final   Comment:            The GeneXpert MRSA Assay (FDA     approved for NASAL specimens     only), is one component of a     comprehensive MRSA colonization     surveillance program. It is not     intended to diagnose MRSA     infection nor to guide or     monitor treatment for     MRSA infections.     Labs: Basic Metabolic Panel:  Recent Labs Lab 01/26/14 0105 01/26/14 0710 01/27/14 0553 01/28/14 0320 01/29/14 0535 01/30/14 0530  NA 142  --  139 142 143 138  K 3.6*  --  3.6* 3.7 3.7 3.6*  CL 100  --  103 104 104 102  CO2 22  --  24 26 25 23   GLUCOSE 99  --  86 84 91 122*  BUN 4*  --  5* 3* 3* 7  CREATININE 0.66 0.66 0.64 0.74 0.73 0.78  CALCIUM 9.9  --  8.4 9.1 9.6 9.0  MG  --   --  1.5 1.5 1.5 1.5  PHOS  --   --  3.2 2.7 4.4 3.0   Liver Function Tests:  Recent Labs Lab 01/26/14 0105 01/27/14 0553 01/28/14 0320 01/29/14 0535 01/30/14 0530  AST 51*  --   --   --   --   ALT 46  --   --   --   --   ALKPHOS 111  --   --   --   --   BILITOT 0.3   --   --   --   --   PROT 8.6*  --   --   --   --   ALBUMIN 3.8 2.9* 3.3* 3.5 3.0*    Recent Labs Lab 01/26/14 0228  LIPASE 145*   No results found for this basename: AMMONIA,  in the last 168 hours CBC:  Recent Labs Lab 01/26/14 0228 01/26/14 0710 01/27/14 0553  WBC 6.0 5.3 4.5  NEUTROABS 2.6  --   --   HGB 14.7 14.1 13.3  HCT 41.6 41.0 38.7*  MCV 93.1 93.8 94.2  PLT 246 240 197   Cardiac Enzymes: No results found for this basename: CKTOTAL, CKMB, CKMBINDEX, TROPONINI,  in the last 168 hours BNP: BNP (last 3 results) No results found for this basename: PROBNP,  in the last 8760 hours CBG: No results found for this basename: GLUCAP,  in the last 168 hours     Signed:  Esperanza SheetsBURIEV, Krystel Fletchall N  [facility] 01/30/2014, 9:27 AM

## 2014-01-30 NOTE — Progress Notes (Signed)
   CARE MANAGEMENT NOTE 01/30/2014  Patient:  Jermaine Deleon,[name]   Account Number:  [id number]  Date Initiated:  01/29/2014  Documentation initiated by:  Jermaine Deleon  Subjective/Objective Assessment:   53 year old male admitted with abdominal pain nausea and vomiting.     Action/Plan:   From home.   Anticipated DC Date:  02/01/2014   Anticipated DC Plan:  HOME/SELF CARE  In-house referral  Clinical Social Worker      DC Planning Services  CM consult  MATCH Program  Medication Assistance      Choice offered to / List presented to:             Status of service:  Completed, signed off Medicare Important Message given?  NA - LOS <3 / Initial given by admissions (If response is "NO", the following Medicare IM given date fields will be blank) Date Medicare IM given:   Date Additional Medicare IM given:    Discharge Disposition:  HOME/SELF CARE  Per UR Regulation:  Reviewed for med. necessity/level of care/duration of stay  If discussed at Long Length of Stay Meetings, dates discussed:    Comments:  01/30/2014 0945 NCM spoke to pt and states his PCP, [facility] [place]. He will arrange an appt with the clinic for 1 week. He is waiting on his insurance coverage to start. Explained MATCH program and available only once per year. He will have a $3.00 copay and no narcotics allowed with this MATCH letter. Pt verbalized understanding. Explained he can use GoodRx.com or needymeds.com for future Rx and discounts.  Jermaine DonningAlesia Kenyon Eshleman RN CCM Case Mgmt phone [phone]

## 2014-01-30 NOTE — Progress Notes (Signed)
Pt discharged home.  Friend is taking him home. Reviewed discharge papers and prescriptions with pt.  Care corrinater contacted to help with prescriptions. There were no further questions.

## 2014-01-30 NOTE — Progress Notes (Signed)
Patient ringing systole on hear monitor.  Went in to assess patient and he reported that he removed his tele box himself because his doctor said that he was going home.  Patient also asking to speak with CM regarding medication assistance.  Weekend CM paged and is to come see patient.

## 2014-04-15 ENCOUNTER — Telehealth: Payer: Self-pay | Admitting: Infectious Disease

## 2014-04-15 NOTE — Telephone Encounter (Signed)
[place] infected [place] from [place]. [facility]ly [facility] by Dr. Lajuana Ripplehieleman.  [facility]

## 2014-04-20 NOTE — Telephone Encounter (Signed)
It appears he had paraspinal abscess that resolved on repeat imaging. Not imaged in past several years. He should have an MRI of L spine at [facility] in near future if still with sig back pain.  Need "paper" records on prior genotypes. I also sent HIV archive genosure on him. I would be shocked if not with significant R given his viremia on labs done at [facility] several times recently.

## 2014-04-23 DIAGNOSIS — B20 Human immunodeficiency virus [HIV] disease: Secondary | ICD-10-CM

## 2014-05-07 DIAGNOSIS — B20 Human immunodeficiency virus [HIV] disease: Secondary | ICD-10-CM

## 2014-05-24 ENCOUNTER — Telehealth: Payer: Self-pay | Admitting: Infectious Disease

## 2014-05-24 NOTE — Telephone Encounter (Signed)
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

## 2014-05-31 NOTE — Telephone Encounter (Signed)
He is on Tivicay and Truvada and should be fine on this with only R to EFV

## 2015-03-24 DIAGNOSIS — B20 Human immunodeficiency virus [HIV] disease: Secondary | ICD-10-CM

## 2015-04-15 ENCOUNTER — Other Ambulatory Visit: Payer: Self-pay | Admitting: Infectious Diseases

## 2015-06-20 DIAGNOSIS — B20 Human immunodeficiency virus [HIV] disease: Secondary | ICD-10-CM

## 2015-07-11 DIAGNOSIS — B20 Human immunodeficiency virus [HIV] disease: Secondary | ICD-10-CM

## 2015-11-14 ENCOUNTER — Other Ambulatory Visit: Payer: Self-pay | Admitting: Infectious Disease

## 2015-12-26 DIAGNOSIS — B20 Human immunodeficiency virus [HIV] disease: Secondary | ICD-10-CM

## 2016-03-03 ENCOUNTER — Inpatient Hospital Stay (HOSPITAL_COMMUNITY)
Admission: EM | Admit: 2016-03-03 | Discharge: 2016-03-05 | DRG: 439 | Discharge status: Against Medical Advice | Payer: Self-pay | Attending: Internal Medicine | Admitting: Internal Medicine

## 2016-03-03 ENCOUNTER — Encounter (HOSPITAL_COMMUNITY): Payer: Self-pay | Admitting: Emergency Medicine

## 2016-03-03 DIAGNOSIS — N179 Acute kidney failure, unspecified: Secondary | ICD-10-CM | POA: Diagnosis present

## 2016-03-03 DIAGNOSIS — F10230 Alcohol dependence with withdrawal, uncomplicated: Secondary | ICD-10-CM

## 2016-03-03 DIAGNOSIS — E872 Acidosis, unspecified: Secondary | ICD-10-CM

## 2016-03-03 DIAGNOSIS — F10239 Alcohol dependence with withdrawal, unspecified: Secondary | ICD-10-CM | POA: Diagnosis present

## 2016-03-03 DIAGNOSIS — K861 Other chronic pancreatitis: Secondary | ICD-10-CM | POA: Diagnosis present

## 2016-03-03 DIAGNOSIS — Z9049 Acquired absence of other specified parts of digestive tract: Secondary | ICD-10-CM

## 2016-03-03 DIAGNOSIS — K920 Hematemesis: Secondary | ICD-10-CM | POA: Diagnosis present

## 2016-03-03 DIAGNOSIS — E876 Hypokalemia: Secondary | ICD-10-CM | POA: Diagnosis present

## 2016-03-03 DIAGNOSIS — R110 Nausea: Secondary | ICD-10-CM

## 2016-03-03 DIAGNOSIS — E860 Dehydration: Secondary | ICD-10-CM | POA: Diagnosis present

## 2016-03-03 DIAGNOSIS — R569 Unspecified convulsions: Secondary | ICD-10-CM | POA: Diagnosis present

## 2016-03-03 DIAGNOSIS — K859 Acute pancreatitis without necrosis or infection, unspecified: Principal | ICD-10-CM | POA: Diagnosis present

## 2016-03-03 DIAGNOSIS — Z21 Asymptomatic human immunodeficiency virus [HIV] infection status: Secondary | ICD-10-CM | POA: Diagnosis present

## 2016-03-03 DIAGNOSIS — D638 Anemia in other chronic diseases classified elsewhere: Secondary | ICD-10-CM | POA: Diagnosis present

## 2016-03-03 DIAGNOSIS — B20 Human immunodeficiency virus [HIV] disease: Secondary | ICD-10-CM

## 2016-03-03 DIAGNOSIS — K2920 Alcoholic gastritis without bleeding: Secondary | ICD-10-CM | POA: Diagnosis present

## 2016-03-03 DIAGNOSIS — F17210 Nicotine dependence, cigarettes, uncomplicated: Secondary | ICD-10-CM | POA: Diagnosis present

## 2016-03-03 HISTORY — DX: Gastritis, unspecified, without bleeding: K29.70

## 2016-03-03 HISTORY — DX: Poisoning by unspecified drugs, medicaments and biological substances, accidental (unintentional), initial encounter: T50.901A

## 2016-03-03 HISTORY — DX: Unspecified asthma, uncomplicated: J45.909

## 2016-03-03 HISTORY — DX: Tobacco use: Z72.0

## 2016-03-03 LAB — CBC WITH DIFFERENTIAL/PLATELET
BASOS ABS: 0.1 10*3/uL (ref 0.0–0.1)
Basophils Relative: 1 %
EOS PCT: 0 %
Eosinophils Absolute: 0 10*3/uL (ref 0.0–0.7)
HCT: 46.4 % (ref 39.0–52.0)
Hemoglobin: 16.7 g/dL (ref 13.0–17.0)
LYMPHS PCT: 27 %
Lymphs Abs: 2.9 10*3/uL (ref 0.7–4.0)
MCH: 34.3 pg — ABNORMAL HIGH (ref 26.0–34.0)
MCHC: 36 g/dL (ref 30.0–36.0)
MCV: 95.3 fL (ref 78.0–100.0)
MONO ABS: 0.7 10*3/uL (ref 0.1–1.0)
MONOS PCT: 7 %
Neutro Abs: 6.9 10*3/uL (ref 1.7–7.7)
Neutrophils Relative %: 65 %
PLATELETS: 485 10*3/uL — AB (ref 150–400)
RBC: 4.87 MIL/uL (ref 4.22–5.81)
RDW: 13.4 % (ref 11.5–15.5)
WBC: 10.6 10*3/uL — ABNORMAL HIGH (ref 4.0–10.5)

## 2016-03-03 LAB — I-STAT CG4 LACTIC ACID, ED: LACTIC ACID, VENOUS: 4.04 mmol/L — AB (ref 0.5–2.0)

## 2016-03-03 LAB — COMPREHENSIVE METABOLIC PANEL
ALT: 39 U/L (ref 17–63)
AST: 64 U/L — ABNORMAL HIGH (ref 15–41)
Albumin: 3.9 g/dL (ref 3.5–5.0)
Alkaline Phosphatase: 99 U/L (ref 38–126)
Anion gap: 27 — ABNORMAL HIGH (ref 5–15)
BUN: 21 mg/dL — ABNORMAL HIGH (ref 6–20)
CO2: 12 mmol/L — ABNORMAL LOW (ref 22–32)
CREATININE: 1.13 mg/dL (ref 0.61–1.24)
Calcium: 8.5 mg/dL — ABNORMAL LOW (ref 8.9–10.3)
Chloride: 98 mmol/L — ABNORMAL LOW (ref 101–111)
GFR calc Af Amer: >60 mL/min (ref >60)
Glucose, Bld: 87 mg/dL (ref 65–99)
Potassium: 4.1 mmol/L (ref 3.5–5.1)
Sodium: 137 mmol/L (ref 135–145)
Total Bilirubin: 1.1 mg/dL (ref 0.3–1.2)
Total Protein: 7.2 g/dL (ref 6.5–8.1)

## 2016-03-03 LAB — I-STAT ARTERIAL BLOOD GAS, ED
ACID-BASE DEFICIT: 7 mmol/L — AB (ref 0.0–2.0)
Bicarbonate: 16.6 mEq/L — ABNORMAL LOW (ref 20.0–24.0)
O2 Saturation: 93 %
PO2 ART: 69 mmHg — AB (ref 80.0–100.0)
TCO2: 17 mmol/L (ref 0–100)
pCO2 arterial: 29.6 mmHg — ABNORMAL LOW (ref 35.0–45.0)
pH, Arterial: 7.357 (ref 7.350–7.450)

## 2016-03-03 LAB — BASIC METABOLIC PANEL
Anion gap: 18 — ABNORMAL HIGH (ref 5–15)
BUN: 15 mg/dL (ref 6–20)
CHLORIDE: 102 mmol/L (ref 101–111)
CO2: 17 mmol/L — AB (ref 22–32)
Calcium: 7.8 mg/dL — ABNORMAL LOW (ref 8.9–10.3)
Creatinine, Ser: 0.98 mg/dL (ref 0.61–1.24)
GFR calc non Af Amer: >60 mL/min (ref >60)
GLUCOSE: 72 mg/dL (ref 65–99)
Potassium: 3.7 mmol/L (ref 3.5–5.1)
Sodium: 137 mmol/L (ref 135–145)

## 2016-03-03 LAB — LACTATE DEHYDROGENASE: LDH: 298 U/L — ABNORMAL HIGH (ref 98–192)

## 2016-03-03 LAB — ETHANOL: ALCOHOL ETHYL (B): 128 mg/dL — AB (ref <5)

## 2016-03-03 LAB — SALICYLATE LEVEL: Salicylate Lvl: <4 mg/dL (ref 2.8–30.0)

## 2016-03-03 LAB — LIPASE, BLOOD: Lipase: 29 U/L (ref 11–51)

## 2016-03-03 LAB — ACETAMINOPHEN LEVEL: Acetaminophen (Tylenol), Serum: <10 ug/mL — ABNORMAL LOW (ref 10–30)

## 2016-03-03 LAB — OSMOLALITY: Osmolality: 290 mOsm/kg (ref 275–295)

## 2016-03-03 MED ORDER — ADULT MULTIVITAMIN W/MINERALS CH
1.0000 | ORAL_TABLET | Freq: Every day | ORAL | Status: DC
  Administered 2016-03-04 – 2016-03-05 (×2): 1 via ORAL
  Filled 2016-03-03 (×2): qty 1

## 2016-03-03 MED ORDER — LORAZEPAM 2 MG/ML IJ SOLN: 1.0000 mg | Freq: Once | INTRAMUSCULAR | Status: DC

## 2016-03-03 MED ORDER — FOLIC ACID 1 MG PO TABS
1.0000 mg | ORAL_TABLET | Freq: Every day | ORAL | Status: DC
Start: 2016-03-04 — End: 2016-03-05
  Administered 2016-03-04 – 2016-03-05 (×2): 1 mg via ORAL
  Filled 2016-03-03 (×2): qty 1

## 2016-03-03 MED ORDER — LORAZEPAM 2 MG/ML IJ SOLN
0.0000 mg | Freq: Four times a day (QID) | INTRAMUSCULAR | Status: DC
  Administered 2016-03-04 (×2): 1 mg via INTRAVENOUS
  Filled 2016-03-03 (×2): qty 1

## 2016-03-03 MED ORDER — LORAZEPAM 1 MG PO TABS
1.0000 mg | ORAL_TABLET | Freq: Four times a day (QID) | ORAL | Status: DC | PRN
  Administered 2016-03-04: 1 mg via ORAL
  Filled 2016-03-03: qty 1

## 2016-03-03 MED ORDER — PANTOPRAZOLE SODIUM 40 MG IV SOLR
40.0000 mg | Freq: Two times a day (BID) | INTRAVENOUS | Status: DC
  Administered 2016-03-03 – 2016-03-05 (×4): 40 mg via INTRAVENOUS
  Filled 2016-03-03 (×4): qty 40

## 2016-03-03 MED ORDER — SODIUM CHLORIDE 0.9 % IV BOLUS (SEPSIS)
1000.0000 mL | Freq: Once | INTRAVENOUS | Status: AC
  Administered 2016-03-03: 1000 mL via INTRAVENOUS

## 2016-03-03 MED ORDER — LORAZEPAM 2 MG/ML IJ SOLN
0.5000 mg | Freq: Once | INTRAMUSCULAR | Status: AC
  Administered 2016-03-03: 0.5 mg via INTRAVENOUS
  Filled 2016-03-03: qty 1

## 2016-03-03 MED ORDER — SODIUM CHLORIDE 0.9 % IV SOLN
INTRAVENOUS | Status: AC
Start: 2016-03-03 — End: 2016-03-04
  Administered 2016-03-03: 18:00:00 via INTRAVENOUS

## 2016-03-03 MED ORDER — NALOXONE HCL 0.4 MG/ML IJ SOLN: 0.4000 mg | INTRAMUSCULAR | Status: DC | PRN

## 2016-03-03 MED ORDER — ALBUTEROL SULFATE (2.5 MG/3ML) 0.083% IN NEBU: 2.5000 mg | INHALATION_SOLUTION | RESPIRATORY_TRACT | Status: DC | PRN

## 2016-03-03 MED ORDER — HYDROMORPHONE 1 MG/ML IV SOLN
INTRAVENOUS | Status: DC
  Administered 2016-03-03: 0.5 mg via INTRAVENOUS
  Administered 2016-03-03: 21:00:00 via INTRAVENOUS
  Administered 2016-03-04: 4.9 mg via INTRAVENOUS
  Administered 2016-03-04: 3.3 mg via INTRAVENOUS
  Administered 2016-03-05: 0.9 mg via INTRAVENOUS
  Administered 2016-03-05: 2.1 mg via INTRAVENOUS
  Filled 2016-03-03 (×2): qty 25

## 2016-03-03 MED ORDER — DEXTROSE IN LACTATED RINGERS 5 % IV SOLN
INTRAVENOUS | Status: DC
  Administered 2016-03-04: 01:00:00 via INTRAVENOUS

## 2016-03-03 MED ORDER — VITAMIN B-1 100 MG PO TABS
100.0000 mg | ORAL_TABLET | Freq: Every day | ORAL | Status: DC
  Administered 2016-03-04 – 2016-03-05 (×2): 100 mg via ORAL
  Filled 2016-03-03 (×3): qty 1

## 2016-03-03 MED ORDER — PROCHLORPERAZINE EDISYLATE 5 MG/ML IJ SOLN
10.0000 mg | INTRAMUSCULAR | Status: DC | PRN
  Administered 2016-03-04: 10 mg via INTRAVENOUS
  Filled 2016-03-03 (×4): qty 2

## 2016-03-03 MED ORDER — HYDROMORPHONE HCL 1 MG/ML IJ SOLN
0.5000 mg | Freq: Once | INTRAMUSCULAR | Status: AC
Start: 2016-03-03 — End: 2016-03-03
  Administered 2016-03-03: 0.5 mg via INTRAVENOUS
  Filled 2016-03-03: qty 1

## 2016-03-03 MED ORDER — NICOTINE 21 MG/24HR TD PT24
21.0000 mg | MEDICATED_PATCH | Freq: Every day | TRANSDERMAL | Status: DC
  Administered 2016-03-03 – 2016-03-05 (×3): 21 mg via TRANSDERMAL
  Filled 2016-03-03 (×3): qty 1

## 2016-03-03 MED ORDER — THIAMINE HCL 100 MG/ML IJ SOLN
Freq: Once | INTRAVENOUS | Status: AC
  Administered 2016-03-03: 19:00:00 via INTRAVENOUS
  Filled 2016-03-03: qty 1000

## 2016-03-03 MED ORDER — DIPHENHYDRAMINE HCL 12.5 MG/5ML PO ELIX
12.5000 mg | ORAL_SOLUTION | Freq: Four times a day (QID) | ORAL | Status: DC | PRN
  Filled 2016-03-03: qty 5

## 2016-03-03 MED ORDER — DIPHENHYDRAMINE HCL 50 MG/ML IJ SOLN: 12.5000 mg | Freq: Four times a day (QID) | INTRAMUSCULAR | Status: DC | PRN

## 2016-03-03 MED ORDER — SODIUM CHLORIDE 0.9% FLUSH
3.0000 mL | Freq: Two times a day (BID) | INTRAVENOUS | Status: DC
  Administered 2016-03-04 – 2016-03-05 (×2): 3 mL via INTRAVENOUS

## 2016-03-03 MED ORDER — METOCLOPRAMIDE HCL 5 MG/ML IJ SOLN
10.0000 mg | Freq: Once | INTRAMUSCULAR | Status: AC
  Administered 2016-03-03: 10 mg via INTRAVENOUS
  Filled 2016-03-03: qty 2

## 2016-03-03 MED ORDER — SODIUM CHLORIDE 0.9% FLUSH: 9.0000 mL | INTRAVENOUS | Status: DC | PRN

## 2016-03-03 MED ORDER — THIAMINE HCL 100 MG/ML IJ SOLN: 100.0000 mg | Freq: Every day | INTRAMUSCULAR | Status: DC

## 2016-03-03 MED ORDER — ENOXAPARIN SODIUM 40 MG/0.4ML ~~LOC~~ SOLN
40.0000 mg | SUBCUTANEOUS | Status: DC
  Administered 2016-03-03 – 2016-03-04 (×2): 40 mg via SUBCUTANEOUS
  Filled 2016-03-03 (×2): qty 0.4

## 2016-03-03 MED ORDER — LORAZEPAM 2 MG/ML IJ SOLN
1.0000 mg | Freq: Four times a day (QID) | INTRAMUSCULAR | Status: DC | PRN
  Administered 2016-03-04 (×2): 1 mg via INTRAVENOUS
  Filled 2016-03-03 (×3): qty 1

## 2016-03-03 MED ORDER — HYDROMORPHONE HCL 1 MG/ML IJ SOLN
0.5000 mg | Freq: Once | INTRAMUSCULAR | Status: AC
  Administered 2016-03-03: 0.5 mg via INTRAVENOUS
  Filled 2016-03-03: qty 1

## 2016-03-03 MED ORDER — HYDROMORPHONE HCL 1 MG/ML IJ SOLN
1.0000 mg | Freq: Once | INTRAMUSCULAR | Status: AC
  Administered 2016-03-03: 1 mg via INTRAVENOUS
  Filled 2016-03-03: qty 1

## 2016-03-03 MED ORDER — SODIUM CHLORIDE 0.9 % IV BOLUS (SEPSIS): 1000.0000 mL | Freq: Once | INTRAVENOUS | Status: DC

## 2016-03-03 MED ORDER — PROCHLORPERAZINE EDISYLATE 5 MG/ML IJ SOLN
10.0000 mg | Freq: Once | INTRAMUSCULAR | Status: AC
  Administered 2016-03-03: 10 mg via INTRAVENOUS
  Filled 2016-03-03: qty 2

## 2016-03-03 MED ORDER — LORAZEPAM 2 MG/ML IJ SOLN: 0.0000 mg | Freq: Two times a day (BID) | INTRAMUSCULAR | Status: DC

## 2016-03-03 NOTE — ED Notes (Signed)
Pt c/o abdominal pain with N/V/D onset Wednesday.

## 2016-03-03 NOTE — ED Notes (Signed)
IV attempted x2. Unsuccessful. DR. Clydene PughKnott made aware and US placed at bedside.

## 2016-03-03 NOTE — ED Provider Notes (Signed)
CSN: [id number]     Arrival date & time 03/03/16  1138 History   First MD Initiated Contact with Patient 03/03/16 1243     Chief Complaint  Patient presents with  . Emesis  . Abdominal Pain     (Consider location/radiation/quality/duration/timing/severity/associated sxs/prior Treatment) Patient is a 55 y.o. male presenting with abdominal pain. The history is provided by the patient.  Abdominal Pain Pain location:  Epigastric Pain quality: burning and sharp   Pain radiates to:  Back Pain severity:  Severe Onset quality:  Gradual Duration:  3 days Timing:  Constant Progression:  Worsening Chronicity:  Recurrent Context: alcohol use (chronic) and previous surgery   Relieved by:  Nothing Worsened by:  Nothing tried Ineffective treatments:  None tried Associated symptoms: anorexia, hematemesis (small vollume) and vomiting   Risk factors: alcohol abuse and recent hospitalization (in [place] for same)     Past Medical History  Diagnosis Date  . Alcohol abuse   . HIV (human immunodeficiency virus infection) (HCC)   . Pancreatitis, alcoholic    Past Surgical History  Procedure Laterality Date  . Frenulectomy, upper labial    . Appendectomy    . Cervical disc surgery     Family History  Problem Relation Age of Onset  . COPD Mother   . Other Father     From being shot.   Social History  Substance Use Topics  . Smoking status: Current Every Day Smoker -- 1.00 packs/day for 30 years    Types: Cigarettes  . Smokeless tobacco: Never Used  . Alcohol Use: Yes    Review of Systems  Gastrointestinal: Positive for vomiting, abdominal pain, anorexia and hematemesis (small vollume).  All other systems reviewed and are negative.     Allergies  Ondansetron  Home Medications   Prior to Admission medications   Medication Sig Start Date End Date Taking? Authorizing Provider  albuterol (PROVENTIL HFA;VENTOLIN HFA) 108 (90 BASE) MCG/ACT inhaler Inhale 2 puffs into the  lungs every 6 (six) hours as needed for wheezing or shortness of breath.    Historical Provider, MD  Bismuth Subsalicylate (PEPTO-BISMOL MAX STRENGTH) 525 MG/15ML SUSP Take 15 mLs by mouth every 6 (six) hours as needed (heart burn).    Historical Provider, MD  busPIRone (BUSPAR) 10 MG tablet Take 10 mg by mouth 2 (two) times daily.    Historical Provider, MD  Cyanocobalamin (VITAMIN B-12 CR PO) Take 1 tablet by mouth daily.    Historical Provider, MD  FOLIC ACID PO Take 1 tablet by mouth daily.    Historical Provider, MD  levofloxacin (LEVAQUIN) 750 MG tablet Take 1 tablet (750 mg total) by mouth daily. 01/30/14   Esperanza SheetsUlugbek N Buriev, MD  Melatonin 5 MG CAPS Take 5 mg by mouth at bedtime.    Historical Provider, MD  Multiple Vitamin (MULTIVITAMIN WITH MINERALS) TABS tablet Take 1 tablet by mouth daily.    Historical Provider, MD  naproxen sodium (ANAPROX) 220 MG tablet Take 220 mg by mouth 2 (two) times daily as needed (pain).    Historical Provider, MD  nicotine (NICODERM CQ - DOSED IN MG/24 HOURS) 21 mg/24hr patch Place 1 patch (21 mg total) onto the skin daily. 01/30/14   Esperanza SheetsUlugbek N Buriev, MD  omeprazole (PRILOSEC) 20 MG capsule Take 20 mg by mouth daily.    Historical Provider, MD  oxyCODONE (OXY IR/ROXICODONE) 5 MG immediate release tablet Take 1 tablet (5 mg total) by mouth every 4 (four) hours as needed for severe pain.  01/30/14   Esperanza Sheets, MD  tetrahydrozoline-zinc (VISINE-AC) 0.05-0.25 % ophthalmic solution Place 2 drops into both eyes 3 (three) times daily as needed (dry eyes).    Historical Provider, MD  Thiamine Mononitrate (VITAMIN B1 PO) Take 1 tablet by mouth daily.    Historical Provider, MD  traZODone (DESYREL) 100 MG tablet Take 100 mg by mouth at bedtime.    Historical Provider, MD   BP 136/84 mmHg  Pulse 108  Temp(Src) 98 F (36.7 C)  Resp 24  Ht  (1.727 m)  SpO2 99% Physical Exam  Constitutional: He is oriented to person, place, and time. He appears well-developed. He  appears toxic. He appears distressed.  HENT:  Head: Normocephalic and atraumatic.  Eyes: Conjunctivae are normal.  Neck: Neck supple. No tracheal deviation present.  Cardiovascular: Regular rhythm.  Tachycardia present.   Pulmonary/Chest: Effort normal and breath sounds normal. No respiratory distress.  Abdominal: Soft. He exhibits no distension. There is tenderness in the epigastric area. There is guarding (voluntary). There is no rigidity, no rebound, no tenderness at McBurney's point and negative Murphy's sign.  Neurological: He is alert and oriented to person, place, and time. He displays tremor (fine motor). GCS eye subscore is 4. GCS verbal subscore is 5. GCS motor subscore is 6.  Skin: Skin is warm and dry.  Psychiatric: His mood appears anxious. His speech is rapid and/or pressured. He is not actively hallucinating.  Vitals reviewed.   ED Course  Procedures (including critical care time)  Procedure note: Ultrasound Guided Peripheral IV Ultrasound guided peripheral 1.88 inch angiocath IV placement performed by me. Indications: Nursing unable to place IV. Details: The antecubital fossa and upper arm were evaluated with a multifrequency linear probe. Patent brachial veins were noted. 1 attempt was made to cannulate a vein under realtime US guidance with successful cannulation of the vein and catheter placement. There is return of non-pulsatile dark red blood. The patient tolerated the procedure well without complications. Images archived electronically.  CPT codes: 09811 and 36410   Labs Review Labs Reviewed  COMPREHENSIVE METABOLIC PANEL - Abnormal; Notable for the following:    Chloride 98 (*)    CO2 12 (*)    BUN 21 (*)    Calcium 8.5 (*)    AST 64 (*)    Anion gap 27 (*)    All other components within normal limits  ETHANOL - Abnormal; Notable for the following:    Alcohol, Ethyl (B) 128 (*)    All other components within normal limits  CBC WITH DIFFERENTIAL/PLATELET -  Abnormal; Notable for the following:    WBC 10.6 (*)    MCH 34.3 (*)    Platelets 485 (*)    All other components within normal limits  LACTATE DEHYDROGENASE - Abnormal; Notable for the following:    LDH 298 (*)    All other components within normal limits  I-STAT ARTERIAL BLOOD GAS, ED - Abnormal; Notable for the following:    pCO2 arterial 29.6 (*)    pO2, Arterial 69.0 (*)    Bicarbonate 16.6 (*)    Acid-base deficit 7.0 (*)    All other components within normal limits  I-STAT CG4 LACTIC ACID, ED - Abnormal; Notable for the following:    Lactic Acid, Venous 4.04 (*)    All other components within normal limits  C DIFFICILE QUICK SCREEN W PCR REFLEX  LIPASE, BLOOD  COMPREHENSIVE METABOLIC PANEL  CBC  URINE RAPID DRUG SCREEN, HOSP PERFORMED  SALICYLATE LEVEL  ACETAMINOPHEN LEVEL  OSMOLALITY  BASIC METABOLIC PANEL   Imaging Review No results found. I have personally reviewed and evaluated these images and lab results as part of my medical decision-making.   EKG Interpretation   Vent. rate 89 BPM PR interval 144 ms QRS duration 80 ms QT/QTc 356/433 ms P-R-T axes 76 47 57 Normal sinus rhythm Nonspecific T wave abnormality Abnormal ECG No previous tracing Confirmed by Sawyer Mentzer MD, Reuel Boom ([id number]) on 03/03/2016 7:10:44 PM  MDM   Final diagnoses:  Acute on chronic pancreatitis (HCC)  Severe dehydration  Lactic acidosis    55 y.o. male presents with acute on chronic pancreatitis symptoms and inability to adequately tolerate PO at home d/t pain and recurrent N/V. Moderately ill, tremulous and in pain on arrival. Given analgesia, ativan for pain and signs of mild withdrawal. Appears improved but initial labs show CO2 of 12 and gap of 27. After 2L of IVF lactate is 4 suggesting volume contraction and impending hemodynamic compromise. Concern for severe acidosis secondary to dehydration and chronic alcohol intoxication. Hospitalist was consulted for admission and will see the  patient in the emergency department.     Lyndal Pulley, MD 03/03/16 [phone]

## 2016-03-03 NOTE — H&P (Signed)
[facility] History and Physical  Jermaine Deleon ZOX:[id number] DOB: 04/02/1961 DOA: 03/03/2016  Referring physician:  Lyndal Pulley PCP:  No primary care provider on file.   Chief Complaint:  Abdominal pain, nausea and vomiting  HPI:  The patient is a 55 y.o. year-old male with history of alcohol abuse, HIV on ART with undetectable viral load, patient unsure of his CD4 count, tobacco abuse with reactive airway disease/chronic bronchitis, hx of alcoholic gastritis and pancreatitis who presents with abdominal pain, nausea, and vomiting.  The patient was last at their baseline health until a week prior to admission.  He had been sober, but then started drinkin 5-days ago.  He drank a total of 4 fifths of liquor since then.  Three days ago, he developed watery diarrhea, some dark black without obvious blood, followed by intractable nausea and vomiting.  He had 4-5 watery stools per day over the last few days and due to vomiting, has been unable to keep much food or liquid down or take his medications.  Some of his last emesis had some coffee ground appearance with streaks of blood.  His abdominal pain has been 10/10 epigastric and LUQ with radiation to the back, only made better by pain medication in ER.  He denies fevers, chills, chest pains.  He denies recent antibiotic exposure or hospitalization, but has been hospitalized before.  He has been in the ICU for alcohol withdrawal with hallucinations and seizures.    In the emergency department, VS notable for tachycardia, blood pressures stable.  Labs were notable for + anion gap metabolic acidosis with bicarb of 12, CBG 87.  After 2 L of IVF, his lactic acid was 4.  AST was minimally elevated, but ALT was normal and EtOh level was 128.  He was given dilaudid, ativan, compazine, reglan, and 3L of IVF in the emergency department with ongoing nausea and vomiting and is being admitted for further management.      Review of Systems:  General:  Denies  fevers, chills, weight loss or gain HEENT:  Denies changes to hearing and vision, rhinorrhea, sinus congestion, positive sore throat CV:  Denies chest pain and palpitations, lower extremity edema.  PULM:  Denies SOB, wheezing, cough.   GI:  Per HPI GU:  Denies dysuria, frequency, urgency:  Decreased frequency ENDO:  Denies polyuria, polydipsia.   HEME:  Denies hematemesis, blood in stools, melena, abnormal bruising or bleeding.  LYMPH:  Denies lymphadenopathy.   MSK:  Denies arthralgias, myalgias.   DERM:  Denies skin rash or ulcer.   NEURO:  Denies focal numbness, weakness, slurred speech, confusion, facial droop.  PSYCH:  Denies anxiety and depression.    Past Medical History  Diagnosis Date  . Alcohol abuse   . HIV (human immunodeficiency virus infection) (HCC)     undetectable 2 months ago, CD 4 unknown   . Pancreatitis, alcoholic   . Reactive airway disease   . Gastritis   . Overdose   . Tobacco use    Past Surgical History  Procedure Laterality Date  . Frenulectomy, upper labial    . Appendectomy    . Cervical disc surgery      twice  . Cholecystectomy     Social History:  reports that he has been smoking Cigarettes.  He has a 30 pack-year smoking history. He has never used smokeless tobacco. He reports that he drinks alcohol. He reports that he does not use illicit drugs.  Allergies  Allergen Reactions  .  Ondansetron Nausea And Vomiting    PT vomited when given Zofran. Later PT informed staff that he vomits every time he's been given Zofran    Family History  Problem Relation Age of Onset  . COPD Mother   . Other Father     From being shot.  . Diabetes Mellitus II Father      Prior to Admission medications   Medication Sig Start Date End Date Taking? Authorizing Provider  Ascorbic Acid (VITAMIN C) 1000 MG tablet Take 1,000 mg by mouth daily. 02/14/16 02/13/17 Yes Historical Provider, MD  Dolutegravir Sodium (TIVICAY) 25 MG TABS Take 25 mg by mouth at bedtime.    Yes Historical Provider, MD  emtricitabine-tenofovir AF (DESCOVY) 200-25 MG tablet Take 1 tablet by mouth at bedtime. 07/05/15  Yes Historical Provider, MD  folic acid (FOLVITE) 1 MG tablet Take 1 mg by mouth daily. 02/14/16 02/13/17 Yes Historical Provider, MD  lisinopril (PRINIVIL,ZESTRIL) 10 MG tablet Take 10 mg by mouth daily.   Yes Historical Provider, MD  thiamine 100 MG tablet Take 100 mg by mouth daily. 02/14/16 02/13/17 Yes Historical Provider, MD  albuterol (PROVENTIL HFA;VENTOLIN HFA) 108 (90 BASE) MCG/ACT inhaler Inhale 2 puffs into the lungs every 6 (six) hours as needed for wheezing or shortness of breath.    Historical Provider, MD  Cyanocobalamin (VITAMIN B-12 CR PO) Take 1 tablet by mouth daily.    Historical Provider, MD  FOLIC ACID PO Take 1 tablet by mouth daily.    Historical Provider, MD  Multiple Vitamin (MULTIVITAMIN WITH MINERALS) TABS tablet Take 1 tablet by mouth daily.    Historical Provider, MD   Physical Exam: Filed Vitals:   03/03/16 1530 03/03/16 1745 03/03/16 1800 03/03/16 1825  BP: 124/77 133/78 104/66 118/64  Pulse: 103 101 103 96  Temp:    98.9 F (37.2 C)  TempSrc:    Oral  Resp:    19  Height:      SpO2: 93% 93% 92% 93%     General:  Adult male, flushed, difficulty focusing on conversation  Eyes:  PERRL, anicteric, non-injected.  ENT:  Nares clear.  OP clear, non-erythematous without plaques or exudates, but tonsils and posterior pharynx erythematous.  Dry MM  Neck:  Supple without TM or JVD.    Lymph:  No cervical, supraclavicular, or submandibular LAD.  Cardiovascular:  Tachycardic RR, normal S1, S2, without m/r/g.  2+ pulses, warm extremities  Respiratory:  CTA bilaterally without increased WOB.  Abdomen:  NABS.  Soft, ND, TTP diffusely without rebound or guarding.    Skin:  No rashes or focal lesions.  Musculoskeletal:  Normal bulk and tone.  No LE edema.  Psychiatric:  A & O x 4.  Appropriate affect.  Neurologic:  CN 3-12 intact.   5/5 strength.  Sensation intact.  Mildly tremulous  Labs on Admission:  Basic Metabolic Panel:  Recent Labs Lab 03/03/16 1311  NA 137  K 4.1  CL 98*  CO2 12*  GLUCOSE 87  BUN 21*  CREATININE 1.13  CALCIUM 8.5*   Liver Function Tests:  Recent Labs Lab 03/03/16 1311  AST 64*  ALT 39  ALKPHOS 99  BILITOT 1.1  PROT 7.2  ALBUMIN 3.9    Recent Labs Lab 03/03/16 1311  LIPASE 29   No results for input(s): AMMONIA in the last 168 hours. CBC:  Recent Labs Lab 03/03/16 1311  WBC 10.6*  NEUTROABS 6.9  HGB 16.7  HCT 46.4  MCV 95.3  PLT  485*   Cardiac Enzymes: No results for input(s): CKTOTAL, CKMB, CKMBINDEX, TROPONINI in the last 168 hours.  BNP (last 3 results) No results for input(s): BNP in the last 8760 hours.  ProBNP (last 3 results) No results for input(s): PROBNP in the last 8760 hours.  CBG: No results for input(s): GLUCAP in the last 168 hours.  Radiological Exams on Admission: No results found.  EKG:  pending  Assessment/Plan Active Problems:   Acute on chronic pancreatitis (HCC)  ---  Acute on chronic pancreatitis -  Dilaudid PCA -  CLD -  IVF and antiemetics  Hematemesis and blood in stools, likely due to PUD or alcoholic gastritis -  CLD -  IV PPI -  Repeat CBC in AM -  Type and screen -  May need gastroenterology consult depending on how severe bleeding is  Nausea, vomiting and diarrhea in HIV patient with multiple exposures to health care system, likely related to #1 -  C. Diff PCR  Positive gap metabolic acidosis, likely due to AKI from dehydration, but consider ingestion of ethylene glycol in alcoholic patient. -  Repeat BMP with serum osmolality to calculate osm gap -  Check UA and urine tox -  Check salycilate level and tylenol level -  IVF and repeat creatinine in AM  HIV, on ART, HIV reportedly undetectable -  Hold ART since unable to tolerate any PO currently  EtOH abuse, likely responsible for mild elevation of  AST -  Banana bag -  CIWA protocol -  Seizure precautions  Tobacco abuse -  Did not counsel smoking cessation yet given amount of pain patient was in in ER -  RN to counsel cessation once patient more comfortable -  Nicotine patch 21mg  daily  Diet:  CLD Access:  PIV IVF:  yes Proph:  SCDs given possible GIB  Code Status:  Full code Family Communication: patient and his friend Disposition Plan: Admit to telemetry for now, but I am concerned he may need a higher level of care during this admission  Time spent: 60 min Isobella Ascher [facility] Pager [phone]  If 7PM-7AM, please contact night-coverage [email or web address] Password [email or web address] 03/03/2016, 6:29 PM

## 2016-03-03 NOTE — ED Notes (Signed)
Pt actively vomiting in triage 

## 2016-03-03 NOTE — ED Notes (Signed)
Patient stated that the nausea is back and wanted to know if there was anything he could have.  MD made aware

## 2016-03-03 NOTE — Progress Notes (Addendum)
Patient trasfered from ED to [phone] via stretcher; alert and oriented x 4; complaints of abdominal pain (received IV pain medicine); IV in right upper arm running fluids@125cc /hr. Orient patient to room and unit; gave patient care guide; instructed how to use the call bell and  fall risk precautions. Will continue to monitor the patient.

## 2016-03-03 NOTE — ED Notes (Addendum)
Visitor comes to triage door to report that pt is an alcoholic and needs detox.

## 2016-03-04 DIAGNOSIS — D649 Anemia, unspecified: Secondary | ICD-10-CM

## 2016-03-04 DIAGNOSIS — E876 Hypokalemia: Secondary | ICD-10-CM

## 2016-03-04 LAB — CBC
HEMATOCRIT: 37.7 % — AB (ref 39.0–52.0)
Hemoglobin: 12.9 g/dL — ABNORMAL LOW (ref 13.0–17.0)
MCH: 32.4 pg (ref 26.0–34.0)
MCHC: 34.2 g/dL (ref 30.0–36.0)
MCV: 94.7 fL (ref 78.0–100.0)
PLATELETS: 337 10*3/uL (ref 150–400)
RBC: 3.98 MIL/uL — ABNORMAL LOW (ref 4.22–5.81)
RDW: 13.2 % (ref 11.5–15.5)
WBC: 10.2 10*3/uL (ref 4.0–10.5)

## 2016-03-04 LAB — COMPREHENSIVE METABOLIC PANEL
ALT: 30 U/L (ref 17–63)
AST: 43 U/L — AB (ref 15–41)
Albumin: 3.3 g/dL — ABNORMAL LOW (ref 3.5–5.0)
Alkaline Phosphatase: 79 U/L (ref 38–126)
Anion gap: 10 (ref 5–15)
BUN: 7 mg/dL (ref 6–20)
CHLORIDE: 99 mmol/L — AB (ref 101–111)
CO2: 25 mmol/L (ref 22–32)
Calcium: 7.9 mg/dL — ABNORMAL LOW (ref 8.9–10.3)
Creatinine, Ser: 0.75 mg/dL (ref 0.61–1.24)
GFR calc Af Amer: >60 mL/min (ref >60)
Glucose, Bld: 189 mg/dL — ABNORMAL HIGH (ref 65–99)
Potassium: 3.4 mmol/L — ABNORMAL LOW (ref 3.5–5.1)
SODIUM: 134 mmol/L — AB (ref 135–145)
Total Bilirubin: 2.3 mg/dL — ABNORMAL HIGH (ref 0.3–1.2)
Total Protein: 5.9 g/dL — ABNORMAL LOW (ref 6.5–8.1)

## 2016-03-04 LAB — RAPID URINE DRUG SCREEN, HOSP PERFORMED
Amphetamines: NOT DETECTED
BENZODIAZEPINES: POSITIVE — AB
Barbiturates: NOT DETECTED
COCAINE: NOT DETECTED
Opiates: POSITIVE — AB
Tetrahydrocannabinol: NOT DETECTED

## 2016-03-04 MED ORDER — SODIUM CHLORIDE 0.9 % IV BOLUS (SEPSIS)
1000.0000 mL | Freq: Once | INTRAVENOUS | Status: AC
  Administered 2016-03-04: 1000 mL via INTRAVENOUS

## 2016-03-04 MED ORDER — KCL-LACTATED RINGERS-D5W 20 MEQ/L IV SOLN
INTRAVENOUS | Status: DC
  Administered 2016-03-04 – 2016-03-05 (×3): via INTRAVENOUS
  Filled 2016-03-04 (×4): qty 1000

## 2016-03-04 NOTE — Progress Notes (Signed)
[facility] PROGRESS NOTE  DERMOT GREMILLION ZOX:[id number] DOB: 1961/02/28 DOA: 03/03/2016 PCP: No primary care provider on file.  Brief Summary  The patient is a 55 y.o. year-old male with history of alcohol abuse, HIV on ART, alcoholic gastritis and pancreatitis who presents with abdominal pain, nausea, and vomiting. He started drinking again 5-days ago. He drank a total of 4 fifths of liquor since then. Three days ago, he developed watery diarrhea, dark black without obvious blood, 4-5 times per day.  He also had intractable nausea and vomiting, recently with streaks of blood and coffee ground appearance. His abdominal pain has been 10/10 epigastric and LUQ with radiation to the back, only made better by pain medication in ER. He denied fevers, chills, chest pains. He denied recent antibiotic exposure or hospitalization. He has been in the ICU for alcohol withdrawal with hallucinations and seizures.   In the emergency department, VS notable for tachycardia, blood pressures stable. Labs were notable for + anion gap metabolic acidosis with bicarb of 12, CBG 87. After 2 L of IVF, his lactic acid was 4. AST was minimally elevated, but ALT was normal and EtOh level was 128. He was given dilaudid, ativan, compazine, reglan, and 3L of IVF in the emergency department with ongoing nausea and vomiting and is being admitted for further management.   Assessment/Plan  Acute on chronic pancreatitis, still appears dehydrated and urine still dark - Continue Dilaudid PCA - Continue CLD - IVF and antiemetics -  NS bolus   Hematemesis and blood in stools, likely due to PUD or alcoholic gastritis, no further obvious bleeding - CLD - IV PPI - hgb decreased, but likely due to hemodilution - Have not called GI  Nausea, vomiting and diarrhea in HIV patient with multiple exposures to health care system, likely related to #1 - C. Diff PCR pending  Positive gap metabolic acidosis, likely  due to AKI from dehydration, but consider ingestion of ethylene glycol in alcoholic patient. - No osmotic gap and tylenol and asa levels were undetectable -  Resolving with hydration - UA and urine tox pending  HIV, on ART, HIV reportedly undetectable - Resume ART once tolerating PO  EtOH abuse, likely responsible for mild elevation of AST - CIWA protocol - Seizure precautions  Tobacco abuse - Did not counsel smoking cessation yet given amount of pain patient was in in ER - RN to counsel cessation once patient more comfortable - Nicotine patch  daily  Normocytic anemia -  Iron studies, B12, folate -  TSH -  Occult stool -  Repeat hgb in AM  Hypokalemia -  Add potassium to IVF  Diet:  CLD Access:  PIV IVF:  yes Proph:  lovenox  Code Status: full Family Communication: patient alone Disposition Plan: pending improvement in abdominal pain and nausea and vomiting.  Needs to be able to stay hydrated and tolerate oral pain medications prior to discharge.     Consultants:  None  Procedures:  None  Antibiotics:  none   HPI/Subjective:  Ongoing epigastric and back pain.  States he has a hard time keeping his CO2 monitor in his nose.  Still having severe nausea.  No recent diarrhea or vomiting.  Only voided 2 or three times since admission and urine is still dark.  Feels very thirsty.    Objective: Filed Vitals:   03/04/16 0116 03/04/16 0257 03/04/16 0350 03/04/16 0800  BP: 154/62 131/76    Pulse: 85 87    Temp: 97.5 F (  36.4 C) 98.2 F (36.8 C)    TempSrc: Oral Oral    Resp: 18 14 16 11   Height:      SpO2: 97% 95% 100% 93%    Intake/Output Summary (Last 24 hours) at 03/04/16 1121 Last data filed at 03/04/16 0950  Gross per 24 hour  Intake 702.08 ml  Output    650 ml  Net  52.08 ml   There were no vitals filed for this visit. There is no weight on file to calculate BMI.  Exam:   General:  Adult male, No acute distress  HEENT:  NCAT,  MMM, mild tongue fasciculations  Cardiovascular:  RRR, nl S1, S2 no mrg, 2+ pulses, warm extremities  Respiratory:  CTAB, no increased WOB  Abdomen:   NABS, soft, TTP in the epigastrium without rebound or guarding, ND  MSK:   Normal tone and bulk, no LEE  Neuro:  Grossly moves all extremities.  Mild tremor  Data Reviewed: Basic Metabolic Panel:  Recent Labs Lab 03/03/16 1311 03/03/16 1846 03/04/16 0726  NA 137 137 134*  K 4.1 3.7 3.4*  CL 98* 102 99*  CO2 12* 17* 25  GLUCOSE 87 72 189*  BUN 21* 15 7  CREATININE 1.13 0.98 0.75  CALCIUM 8.5* 7.8* 7.9*   Liver Function Tests:  Recent Labs Lab 03/03/16 1311 03/04/16 0726  AST 64* 43*  ALT 39 30  ALKPHOS 99 79  BILITOT 1.1 2.3*  PROT 7.2 5.9*  ALBUMIN 3.9 3.3*    Recent Labs Lab 03/03/16 1311  LIPASE 29   No results for input(s): AMMONIA in the last 168 hours. CBC:  Recent Labs Lab 03/03/16 1311 03/04/16 0726  WBC 10.6* 10.2  NEUTROABS 6.9  --   HGB 16.7 12.9*  HCT 46.4 37.7*  MCV 95.3 94.7  PLT 485* 337    No results found for this or any previous visit (from the past 240 hour(s)).   Studies: No results found.  Scheduled Meds: . enoxaparin (LOVENOX) injection  40 mg Subcutaneous Q24H  . folic acid  1 mg Oral Daily  . HYDROmorphone   Intravenous 6 times per day  . LORazepam  0-4 mg Intravenous Q6H   Followed by  . [START ON 03/05/2016] LORazepam  0-4 mg Intravenous Q12H  . multivitamin with minerals  1 tablet Oral Daily  . nicotine  21 mg Transdermal Daily  . pantoprazole (PROTONIX) IV  40 mg Intravenous BID  . sodium chloride  1,000 mL Intravenous Once  . sodium chloride flush  3 mL Intravenous Q12H  . thiamine  100 mg Oral Daily   Or  . thiamine  100 mg Intravenous Daily   Continuous Infusions: . dextrose 5% lactated ringers 125 mL/hr at 03/04/16 0121    Active Problems:   Acute on chronic pancreatitis (HCC)    Time spent: 30 min    Deysha Cartier, [facility]ooMACKENZIE  [facility] Pager [phone]. If 7PM-7AM, please contact night-coverage at [email or web address], password [email or web address] 03/04/2016, 11:21 AM

## 2016-03-05 LAB — IRON AND TIBC
Iron: 120 ug/dL (ref 45–182)
Saturation Ratios: 39 % (ref 17.9–39.5)
TIBC: 308 ug/dL (ref 250–450)
UIBC: 188 ug/dL

## 2016-03-05 LAB — CBC
HEMATOCRIT: 39.9 % (ref 39.0–52.0)
Hemoglobin: 13.4 g/dL (ref 13.0–17.0)
MCH: 32.1 pg (ref 26.0–34.0)
MCHC: 33.6 g/dL (ref 30.0–36.0)
MCV: 95.7 fL (ref 78.0–100.0)
PLATELETS: 239 10*3/uL (ref 150–400)
RBC: 4.17 MIL/uL — AB (ref 4.22–5.81)
RDW: 13 % (ref 11.5–15.5)
WBC: 7.5 10*3/uL (ref 4.0–10.5)

## 2016-03-05 LAB — COMPREHENSIVE METABOLIC PANEL
ALBUMIN: 3.8 g/dL (ref 3.5–5.0)
ALT: 27 U/L (ref 17–63)
AST: 35 U/L (ref 15–41)
Alkaline Phosphatase: 90 U/L (ref 38–126)
Anion gap: 13 (ref 5–15)
CHLORIDE: 95 mmol/L — AB (ref 101–111)
CO2: 25 mmol/L (ref 22–32)
CREATININE: 0.56 mg/dL — AB (ref 0.61–1.24)
Calcium: 9.1 mg/dL (ref 8.9–10.3)
GFR calc Af Amer: >60 mL/min (ref >60)
GFR calc non Af Amer: >60 mL/min (ref >60)
Glucose, Bld: 137 mg/dL — ABNORMAL HIGH (ref 65–99)
POTASSIUM: 3.9 mmol/L (ref 3.5–5.1)
Sodium: 133 mmol/L — ABNORMAL LOW (ref 135–145)
Total Bilirubin: 1.6 mg/dL — ABNORMAL HIGH (ref 0.3–1.2)
Total Protein: 6.6 g/dL (ref 6.5–8.1)

## 2016-03-05 LAB — FOLATE: FOLATE: 29.7 ng/mL (ref >5.9)

## 2016-03-05 LAB — FERRITIN: Ferritin: 342 ng/mL — ABNORMAL HIGH (ref 24–336)

## 2016-03-05 LAB — VITAMIN B12: VITAMIN B 12: 898 pg/mL (ref 180–914)

## 2016-03-05 LAB — TSH: TSH: 2.038 u[IU]/mL (ref 0.350–4.500)

## 2016-03-05 MED ORDER — PROCHLORPERAZINE MALEATE 10 MG PO TABS
10.0000 mg | ORAL_TABLET | Freq: Four times a day (QID) | ORAL | Status: DC | PRN
  Filled 2016-03-05: qty 1

## 2016-03-05 MED ORDER — HYDROMORPHONE HCL 1 MG/ML IJ SOLN
0.5000 mg | INTRAMUSCULAR | Status: DC | PRN
Start: 2016-03-05 — End: 2016-03-05

## 2016-03-05 MED ORDER — EMTRICITABINE-TENOFOVIR AF 200-25 MG PO TABS: 1.0000 | ORAL_TABLET | Freq: Every day | ORAL | Status: DC

## 2016-03-05 MED ORDER — OXYCODONE HCL 5 MG PO TABS
5.0000 mg | ORAL_TABLET | ORAL | Status: DC | PRN
Start: 2016-03-05 — End: 2016-03-05

## 2016-03-05 MED ORDER — DOLUTEGRAVIR SODIUM 50 MG PO TABS: 50.0000 mg | ORAL_TABLET | Freq: Every day | ORAL | Status: DC

## 2016-03-05 MED ORDER — LISINOPRIL 10 MG PO TABS
10.0000 mg | ORAL_TABLET | Freq: Every day | ORAL | Status: DC
  Administered 2016-03-05: 10 mg via ORAL
  Filled 2016-03-05: qty 1

## 2016-03-05 NOTE — Progress Notes (Signed)
[facility] PROGRESS NOTE  Jermaine Deleon OZH:[id number] DOB: 19-Mar-1961 DOA: 03/03/2016 PCP: No primary care provider on file.  Brief Summary  The patient is a 55 y.o. year-old male with history of alcohol abuse, HIV on ART, alcoholic gastritis and pancreatitis who presents with abdominal pain, nausea, and vomiting. He started drinking again 5-days ago. He drank a total of 4 fifths of liquor since then. Three days ago, he developed watery diarrhea, dark black without obvious blood, 4-5 times per day.  He also had intractable nausea and vomiting, recently with streaks of blood and coffee ground appearance. His abdominal pain has been 10/10 epigastric and LUQ with radiation to the back, only made better by pain medication in ER. He denied fevers, chills, chest pains. He denied recent antibiotic exposure or hospitalization. He has been in the ICU for alcohol withdrawal with hallucinations and seizures.   In the emergency department, VS notable for tachycardia, blood pressures stable. Labs were notable for + anion gap metabolic acidosis with bicarb of 12, CBG 87. After 2 L of IVF, his lactic acid was 4. AST was minimally elevated, but ALT was normal and EtOh level was 128. He was given dilaudid, ativan, compazine, reglan, and 3L of IVF in the emergency department with ongoing nausea and vomiting and is being admitted for further management.   Assessment/Plan  Acute on chronic pancreatitis, appears improved today - d/c Dilaudid PCA - advance to FLD - decrease IVF  -  Change to oral antiemetics and start oxycodone prn   Hematemesis and blood in stools, likely due to PUD or alcoholic gastritis, no further obvious bleeding - change to oral BID PPI - hgb decreased, but likely due to hemodilution - Have not called GI  Nausea, vomiting and diarrhea in HIV patient with multiple exposures to health care system, likely related to #1.  No stools in 3 days - d/c enteric precautions  and C. Diff PCR  Positive gap metabolic acidosis, likely due to AKI from dehydration, resolved with IVF. - No osmotic gap and tylenol and asa levels were undetectable -  Resolving with hydration - UA pending -  Urine tox c/w medications administered in hospital  HIV, on ART, HIV reportedly undetectable - Resume ART once tolerating PO  EtOH abuse, likely responsible for mild elevation of AST - CIWA protocol - Seizure precautions  Tobacco abuse - Did not counsel smoking cessation yet given amount of pain patient was in in ER - RN to counsel cessation once patient more comfortable - Nicotine patch  daily  Normocytic anemia, likely due to pancreatitis, resolved.  Iron, vitamin B12 wnl   Hypokalemia, resolved with potassium supplementation.  Diet:  FLD Access:  PIV IVF:  yes Proph:  lovenox  Code Status: full Family Communication: patient alone Disposition Plan: may discharge later today if tolerating diet and oral pain medication for symptom management.   Consultants:  None  Procedures:  None  Antibiotics:  none   HPI/Subjective:  Improving epigastric pain.  No vomiting since yesterday.  No stools since admission.  Denies obvious bleeding.  Wants to be discharged this morning.      Objective: Filed Vitals:   03/05/16 0023 03/05/16 0320 03/05/16 0345 03/05/16 0616  BP: 152/65   137/83  Pulse: 65   79  Temp: 97.7 F (36.5 C)   97.8 F (36.6 C)  TempSrc: Oral   Oral  Resp: Height:      SpO2: 96% 94% 94% 95%  Intake/Output Summary (Last 24 hours) at 03/05/16 0837 Last data filed at 03/05/16 0648  Gross per 24 hour  Intake   1503 ml  Output   2525 ml  Net  -1022 ml   There were no vitals filed for this visit. There is no weight on file to calculate BMI.  Exam:   General:  Adult male, No acute distress,  HEENT:  NCAT, MMM, mild tongue fasciculations  Cardiovascular:  RRR, nl S1, S2 no mrg, 2+ pulses, warm  extremities  Respiratory:  CTAB, no increased WOB  Abdomen:   NABS, soft, mildly TTP in the epigastrium without rebound or guarding, ND  MSK:   Normal tone and bulk, no LEE  Neuro:  Grossly moves all extremities.  Mild tremor  Data Reviewed: Basic Metabolic Panel:  Recent Labs Lab 03/03/16 1311 03/03/16 1846 03/04/16 0726 03/05/16 0551  NA 137 137 134* 133*  K 4.1 3.7 3.4* 3.9  CL 98* 102 99* 95*  CO2 12* 17* 25 25  GLUCOSE 87 72 189* 137*  BUN 21* 15 7 <5*  CREATININE 1.13 0.98 0.75 0.56*  CALCIUM 8.5* 7.8* 7.9* 9.1   Liver Function Tests:  Recent Labs Lab 03/03/16 1311 03/04/16 0726 03/05/16 0551  AST 64* 43* 35  ALT 39 30 27  ALKPHOS 99 79 90  BILITOT 1.1 2.3* 1.6*  PROT 7.2 5.9* 6.6  ALBUMIN 3.9 3.3* 3.8    Recent Labs Lab 03/03/16 1311  LIPASE 29   No results for input(s): AMMONIA in the last 168 hours. CBC:  Recent Labs Lab 03/03/16 1311 03/04/16 0726 03/05/16 0551  WBC 10.6* 10.2 7.5  NEUTROABS 6.9  --   --   HGB 16.7 12.9* 13.4  HCT 46.4 37.7* 39.9  MCV 95.3 94.7 95.7  PLT 485* 337 239    No results found for this or any previous visit (from the past 240 hour(s)).   Studies: No results found.  Scheduled Meds: . enoxaparin (LOVENOX) injection  40 mg Subcutaneous Q24H  . folic acid  1 mg Oral Daily  . LORazepam  0-4 mg Intravenous Q6H   Followed by  . LORazepam  0-4 mg Intravenous Q12H  . multivitamin with minerals  1 tablet Oral Daily  . nicotine  21 mg Transdermal Daily  . pantoprazole (PROTONIX) IV  40 mg Intravenous BID  . sodium chloride  1,000 mL Intravenous Once  . sodium chloride flush  3 mL Intravenous Q12H  . thiamine  100 mg Oral Daily   Or  . thiamine  100 mg Intravenous Daily   Continuous Infusions: . dextrose 5% lactated ringers with KCl 20 mEq/L 75 mL/hr at 03/05/16 [id number]    Active Problems:   Acute on chronic pancreatitis (HCC)    Time spent: 30 min    Kandis Henry, [facility] CenterMACKENZIE  [facility] Pager  [phone]. If 7PM-7AM, please contact night-coverage at [email or web address], password [email or web address] 03/05/2016, 8:37 AM  LOS: 1 day

## 2016-03-05 NOTE — Progress Notes (Signed)
Pt left AMA after RN spoke at length to pt, Dr.Short notified, pt signed AMA form, pt denied wheelchair and walked off unit, charge nurse notified

## 2016-03-05 NOTE — Progress Notes (Signed)
Dilaudid PCA D/c'd per MD order , 7cc wasted with Florentina AddisonKatie RN

## 2016-03-05 NOTE — Care Management Note (Signed)
Case Management Note  Patient Details  Name: Rudi RummageRalph C Fern MRN: [id number] Date of Birth: 1961-01-05  Subjective/Objective:                 Upon entering room, patient discussing DC AMA with bedside RN. RN stated patient will leaving AMA.     Action/Plan:  No CM assessment completed 2/2 patient leaving AMA.  Expected Discharge Date:                  Expected Discharge Plan:  Home/Self Care  In-House Referral:     Discharge planning Services  CM Consult  Post Acute Care Choice:    Choice offered to:     DME Arranged:    DME Agency:     HH Arranged:    HH Agency:     Status of Service:  Completed, signed off  Medicare Important Message Given:    Date Medicare IM Given:    Medicare IM give by:    Date Additional Medicare IM Given:    Additional Medicare Important Message give by:     If discussed at Long Length of Stay Meetings, dates discussed:    Additional Comments:  Lawerance SabalDebbie Lanier Millon, RN 03/05/2016, 11:57 AM

## 2016-03-06 ENCOUNTER — Emergency Department (HOSPITAL_COMMUNITY)
Admission: EM | Admit: 2016-03-06 | Discharge: 2016-03-06 | Disposition: A | Discharge status: Home / Self Care | Payer: Self-pay | Attending: Emergency Medicine | Admitting: Emergency Medicine

## 2016-03-06 ENCOUNTER — Emergency Department (HOSPITAL_COMMUNITY): Payer: Self-pay

## 2016-03-06 ENCOUNTER — Encounter (HOSPITAL_COMMUNITY): Payer: Self-pay | Admitting: Emergency Medicine

## 2016-03-06 DIAGNOSIS — K861 Other chronic pancreatitis: Secondary | ICD-10-CM | POA: Insufficient documentation

## 2016-03-06 DIAGNOSIS — F17210 Nicotine dependence, cigarettes, uncomplicated: Secondary | ICD-10-CM | POA: Insufficient documentation

## 2016-03-06 DIAGNOSIS — J45909 Unspecified asthma, uncomplicated: Secondary | ICD-10-CM | POA: Insufficient documentation

## 2016-03-06 DIAGNOSIS — I10 Essential (primary) hypertension: Secondary | ICD-10-CM | POA: Insufficient documentation

## 2016-03-06 DIAGNOSIS — B20 Human immunodeficiency virus [HIV] disease: Secondary | ICD-10-CM | POA: Insufficient documentation

## 2016-03-06 DIAGNOSIS — Z79899 Other long term (current) drug therapy: Secondary | ICD-10-CM | POA: Insufficient documentation

## 2016-03-06 DIAGNOSIS — R112 Nausea with vomiting, unspecified: Secondary | ICD-10-CM

## 2016-03-06 HISTORY — DX: Essential (primary) hypertension: I10

## 2016-03-06 LAB — URINALYSIS, ROUTINE W REFLEX MICROSCOPIC
Bilirubin Urine: NEGATIVE
Glucose, UA: NEGATIVE mg/dL
Ketones, ur: NEGATIVE mg/dL
LEUKOCYTES UA: NEGATIVE
NITRITE: NEGATIVE
PH: 7.5 (ref 5.0–8.0)
Protein, ur: NEGATIVE mg/dL
SPECIFIC GRAVITY, URINE: 1.009 (ref 1.005–1.030)

## 2016-03-06 LAB — COMPREHENSIVE METABOLIC PANEL
ALBUMIN: 3.8 g/dL (ref 3.5–5.0)
ALK PHOS: 94 U/L (ref 38–126)
ALT: 51 U/L (ref 17–63)
ANION GAP: 15 (ref 5–15)
AST: 74 U/L — AB (ref 15–41)
BILIRUBIN TOTAL: 1.6 mg/dL — AB (ref 0.3–1.2)
CO2: 25 mmol/L (ref 22–32)
Calcium: 9.6 mg/dL (ref 8.9–10.3)
Chloride: 97 mmol/L — ABNORMAL LOW (ref 101–111)
Creatinine, Ser: 0.78 mg/dL (ref 0.61–1.24)
GFR calc Af Amer: >60 mL/min (ref >60)
GLUCOSE: 112 mg/dL — AB (ref 65–99)
Potassium: 3.3 mmol/L — ABNORMAL LOW (ref 3.5–5.1)
Sodium: 137 mmol/L (ref 135–145)
Total Protein: 7.1 g/dL (ref 6.5–8.1)

## 2016-03-06 LAB — URINE MICROSCOPIC-ADD ON: Squamous Epithelial / LPF: NONE SEEN

## 2016-03-06 LAB — CBC
HEMATOCRIT: 44 % (ref 39.0–52.0)
HEMOGLOBIN: 15.9 g/dL (ref 13.0–17.0)
MCH: 34.2 pg — ABNORMAL HIGH (ref 26.0–34.0)
MCHC: 36.1 g/dL — AB (ref 30.0–36.0)
MCV: 94.6 fL (ref 78.0–100.0)
Platelets: 291 10*3/uL (ref 150–400)
RBC: 4.65 MIL/uL (ref 4.22–5.81)
RDW: 13 % (ref 11.5–15.5)
WBC: 12.3 10*3/uL — ABNORMAL HIGH (ref 4.0–10.5)

## 2016-03-06 LAB — I-STAT TROPONIN, ED: TROPONIN I, POC: 0 ng/mL (ref 0.00–0.08)

## 2016-03-06 LAB — ETHANOL: Alcohol, Ethyl (B): <5 mg/dL (ref <5)

## 2016-03-06 LAB — LIPASE, BLOOD: Lipase: 71 U/L — ABNORMAL HIGH (ref 11–51)

## 2016-03-06 MED ORDER — IOPAMIDOL (ISOVUE-300) INJECTION 61%
INTRAVENOUS | Status: AC
  Administered 2016-03-06: 80 mL
  Filled 2016-03-06: qty 100

## 2016-03-06 MED ORDER — HYDROMORPHONE HCL 1 MG/ML IJ SOLN
1.0000 mg | Freq: Once | INTRAMUSCULAR | Status: AC
  Administered 2016-03-06: 1 mg via INTRAVENOUS
  Filled 2016-03-06: qty 1

## 2016-03-06 MED ORDER — PROCHLORPERAZINE EDISYLATE 5 MG/ML IJ SOLN
10.0000 mg | Freq: Once | INTRAMUSCULAR | Status: AC
  Administered 2016-03-06: 10 mg via INTRAVENOUS
  Filled 2016-03-06: qty 2

## 2016-03-06 MED ORDER — PROMETHAZINE HCL 25 MG PO TABS: 25.0000 mg | ORAL_TABLET | Freq: Four times a day (QID) | ORAL | Status: DC | PRN

## 2016-03-06 MED ORDER — SODIUM CHLORIDE 0.9 % IV BOLUS (SEPSIS)
1000.0000 mL | Freq: Once | INTRAVENOUS | Status: AC
  Administered 2016-03-06: 1000 mL via INTRAVENOUS

## 2016-03-06 MED ORDER — GI COCKTAIL ~~LOC~~
30.0000 mL | Freq: Once | ORAL | Status: AC
  Administered 2016-03-06: 30 mL via ORAL
  Filled 2016-03-06: qty 30

## 2016-03-06 MED ORDER — LORAZEPAM 1 MG PO TABS
1.0000 mg | ORAL_TABLET | Freq: Once | ORAL | Status: AC
  Administered 2016-03-06: 1 mg via ORAL
  Filled 2016-03-06: qty 1

## 2016-03-06 MED ORDER — PROMETHAZINE HCL 25 MG PO TABS: 25.0000 mg | ORAL_TABLET | Freq: Four times a day (QID) | ORAL | Status: AC | PRN

## 2016-03-06 MED ORDER — PROMETHAZINE HCL 25 MG RE SUPP: 25.0000 mg | Freq: Four times a day (QID) | RECTAL | Status: AC | PRN

## 2016-03-06 MED ORDER — PROMETHAZINE HCL 25 MG RE SUPP: 25.0000 mg | Freq: Four times a day (QID) | RECTAL | Status: DC | PRN

## 2016-03-06 NOTE — Discharge Summary (Addendum)
Physician Discharge Summary  Jermaine Deleon:[id number] DOB: 09-03-1961 DOA: 03/03/2016  PCP: No primary care provider on file.  Admit date: 03/03/2016 Patient left AMA on 03/05/2016  Discharge Diagnoses:  Active Problems:   Acute on chronic pancreatitis (HCC)   Wt Readings from Last 3 Encounters:  01/26/14 66.6 kg (146 lb 13.2 oz)  11/25/13 55.3 kg (121 lb 14.6 oz)    History of present illness:    The patient is a 55 y.o. year-old male with history of alcohol abuse, HIV on ART, alcoholic gastritis and pancreatitis who presents with abdominal pain, nausea, and vomiting. He started drinking again 5-days ago. He drank a total of 4 fifths of liquor since then. Three days ago, he developed watery diarrhea, dark black without obvious blood, 4-5 times per day. He also had intractable nausea and vomiting, recently with streaks of blood and coffee ground appearance. His abdominal pain has been 10/10 epigastric and LUQ with radiation to the back, only made better by pain medication in ER. He denied fevers, chills, chest pains. He denied recent antibiotic exposure or hospitalization. He has been in the ICU for alcohol withdrawal with hallucinations and seizures.   In the emergency department, VS notable for tachycardia, blood pressures stable. Labs were notable for + anion gap metabolic acidosis with bicarb of 12, CBG 87. After 2 L of IVF, his lactic acid was 4. AST was minimally elevated, but ALT was normal and EtOh level was 128. He was given dilaudid, ativan, compazine, reglan, and 3L of IVF in the emergency department with ongoing nausea and vomiting and is being admitted for further management.   Hospital Course:   Acute on chronic pancreatitis, appeared improved after two days on dilaudid PCA.  On the day he left AMA, his PCA was discontinued and he advanced to FLD.  His IVF were discontinued.  Advised patient that he had to eat and drink enough to stay hydrated with oral pain and  nausea medication before I would discharge him.  Agreed to reassess later in the day to see if he tolerated lunch and early dinner for discharge in the evening, however, he left AMA mid-morning.    Hematemesis and blood in stools, likely due to PUD or alcoholic gastritis, no further obvious bleeding.  He was started on IV PPI BID and transitioned to oral.  Nausea, vomiting and diarrhea in HIV patient with multiple exposures to health care system, likely related to #1. No stools in 3 days so enteric precautions and C. Diff PCR were discontinued.   Positive gap metabolic acidosis, likely due to AKI from dehydration, resolved with IVF.  No osmotic gap and tylenol and asa levels were undetectable.  Urine drug screen c/w medications administered in hospital  HIV, on ART, HIV reportedly undetectable.  Resumed ART once tolerating PO.  EtOH abuse, likely responsible for mild elevation of AST.  CIWA protocol was used with Ativan prn.  He received thiamine, folate, and MVI.    Tobacco abuse.  Did not counsel smoking cessation given amount of pain patient was in in ER.  Nicotine patch  daily  Normocytic anemia, likely due to pancreatitis, resolved. Iron, vitamin B12 wnl   Hypokalemia, resolved with potassium supplementation.   Consultants:  None  Procedures:  None  Antibiotics:  none  Discharge Exam: Filed Vitals:   03/05/16 0345 03/05/16 0616  BP:  137/83  Pulse:  79  Temp:  97.8 F (36.6 C)  Resp: 13 18   Filed Vitals:  03/05/16 0023 03/05/16 0320 03/05/16 0345 03/05/16 0616  BP: 152/65   137/83  Pulse: 65   79  Temp: 97.7 F (36.5 C)   97.8 F (36.6 C)  TempSrc: Oral   Oral  Resp: 16 13 13 18   Height:      SpO2: 96% 94% 94% 95%    General: Adult male, No acute distress,  HEENT: NCAT, MMM, mild tongue fasciculations  Cardiovascular: RRR, nl S1, S2 no mrg, 2+ pulses, warm extremities  Respiratory: CTAB, no increased WOB  Abdomen: NABS, soft, mildly TTP  in the epigastrium without rebound or guarding, ND  MSK: Normal tone and bulk, no LEE  Neuro: Grossly moves all extremities. Mild tremor  Discharge Instructions     Medication List    ASK your doctor about these medications        albuterol 108 (90 Base) MCG/ACT inhaler  Commonly known as:  PROVENTIL HFA;VENTOLIN HFA  Inhale 2 puffs into the lungs every 6 (six) hours as needed for wheezing or shortness of breath.     DESCOVY 200-25 MG tablet  Generic drug:  emtricitabine-tenofovir AF  Take 1 tablet by mouth at bedtime.     folic acid 1 MG tablet  Commonly known as:  FOLVITE  Take 1 mg by mouth daily.     lisinopril 10 MG tablet  Commonly known as:  PRINIVIL,ZESTRIL  Take 10 mg by mouth daily.     multivitamin with minerals Tabs tablet  Take 1 tablet by mouth daily.     thiamine 100 MG tablet  Take 100 mg by mouth daily.     TIVICAY 25 MG Tabs  Generic drug:  Dolutegravir Sodium  Take 25 mg by mouth at bedtime.     VITAMIN B-12 CR PO  Take 1 tablet by mouth daily.     vitamin C 1000 MG tablet  Take 1,000 mg by mouth daily.          The results of significant diagnostics from this hospitalization (including imaging, microbiology, ancillary and laboratory) are listed below for reference.    Significant Diagnostic Studies: Ct Abdomen Pelvis W Contrast  03/06/2016  CLINICAL DATA:  Diffuse abdominal pain starting Wednesday, nausea, vomiting, diarrhea EXAM: CT ABDOMEN AND PELVIS WITH CONTRAST TECHNIQUE: Multidetector CT imaging of the abdomen and pelvis was performed using the standard protocol following bolus administration of intravenous contrast. CONTRAST:  80mL ISOVUE-300 IOPAMIDOL (ISOVUE-300) INJECTION 61% COMPARISON:  03/28/2014 FINDINGS: Lower chest: Again noted emphysematous changes lung bases. Stable atelectasis or scarring bilateral lower lobe posteriorly left greater than right. Hepatobiliary: No focal hepatic mass. There is mild intrahepatic biliary  ductal dilatation probable postcholecystectomy. Pancreas: Again noted multiple pancreatic calcifications consistent with chronic calcific pancreatitis. There is interval dilatation of main pancreatic duct up to 6 mm. No definite focal pancreatic mass is noted. Spleen: Enhanced spleen is unremarkable. Adrenals/Urinary Tract: No adrenal gland mass is noted. Enhanced kidneys are symmetrical in size. No hydronephrosis or hydroureter. There is a cyst in midpole lateral aspect of the left kidney measures 1.4 cm. Delayed renal images shows bilateral renal symmetrical excretion. Bilateral visualized proximal ureter is unremarkable. The urinary bladder is unremarkable. Bilateral distal ureter is unremarkable. Stomach/Bowel: No gastric outlet obstruction. No small bowel obstruction. No transition point in caliber of small bowel. There is no pericecal inflammation. Terminal ileum is unremarkable. The patient is status post appendectomy. Some colonic gas and fluid noted within transverse colon. No significant colonic distension. There is no evidence of acute colitis  or diverticulitis. Scattered diverticula are noted descending colon. No distal colonic obstruction. Vascular/Lymphatic: Atherosclerotic calcifications of abdominal aorta and iliac arteries are noted. No evidence of aortic aneurysm. There is no retroperitoneal or mesenteric adenopathy. Reproductive: Prostate gland and seminal vesicles are unremarkable. Other: There is no ascites or free abdominal air. Musculoskeletal: No destructive bony lesions are noted. Sagittal images of the spine shows degenerative changes lumbar spine. Stable Schmorl's node deformities with minimal compression deformity upper endplate T12, L1 and L2 vertebral bodies. IMPRESSION: 1. There is no evidence of acute inflammatory process within abdomen or pelvis. 2. Mild intrahepatic biliary ductal dilatation probable postcholecystectomy. 3. Again noted multiple pancreatic calcifications consistent  with chronic calcific pancreatitis. There is interval dilatation of main pancreatic duct up to 6 mm. Ductal stricture in pancreatic head region cannot be excluded. Clinical correlation is necessary. No focal pancreatic mass is noted. 4. No hydronephrosis or hydroureter. There is a cyst in midpole of the left kidney laterally measures 1.4 cm. 5. No small bowel obstruction. 6. No pericecal inflammation.  Status post appendectomy. 7. Few diverticula are noted descending colon. No evidence of acute diverticulitis. No evidence of acute colitis. 8. Stable degenerative changes lumbar spine. Electronically Signed   By: Natasha MeadLiviu  Pop M.D.   On: 03/06/2016 13:49   Dg Abd Acute W/chest  03/06/2016  CLINICAL DATA:  Pt is vomiting blood, has all over posterior abdomin pain, and anterior chest pain. On and off cough and diarrhea x 6 days. No known fever Hx of HTN- has not taken medication in a while, pt's gallbladder has been removed, and kidney stones, smoker EXAM: DG ABDOMEN ACUTE W/ 1V CHEST COMPARISON:  Report of 11/24/2013, images not available. FINDINGS: Frontal view of the chest demonstrates low cervical spine fixation. Midline trachea. Normal heart size and mediastinal contours for age. No pleural effusion or pneumothorax. Bibasilar scarring or atelectasis. Abdominal films demonstrate no free intraperitoneal air on upright positioning. Scattered air-fluid levels within left abdominal small bowel loops. Gas-filled small bowel loops measuring up to 2.6 cm. Distal gas identified. IMPRESSION: Nonspecific bowel gas pattern, with scattered small bowel air-fluid levels in the left side of the abdomen, but no significant gaseous distention of bowel loops. question mild adynamic ileus, as can be seen with gastroenteritis. No acute cardiopulmonary disease. Electronically Signed   By: Jeronimo GreavesKyle  Talbot M.D.   On: 03/06/2016 12:05    Microbiology: No results found for this or any previous visit (from the past 240 hour(s)).    Labs: Basic Metabolic Panel:  Recent Labs Lab 03/03/16 1311 03/03/16 1846 03/04/16 0726 03/05/16 0551 03/06/16 1024  NA 137 137 134* 133* 137  K 4.1 3.7 3.4* 3.9 3.3*  CL 98* 102 99* 95* 97*  CO2 12* 17* 25 25 25   GLUCOSE 87 72 189* 137* 112*  BUN 21* 15 7 <5* <5*  CREATININE 1.13 0.98 0.75 0.56* 0.78  CALCIUM 8.5* 7.8* 7.9* 9.1 9.6   Liver Function Tests:  Recent Labs Lab 03/03/16 1311 03/04/16 0726 03/05/16 0551 03/06/16 1024  AST 64* 43* 35 74*  ALT 39 30 27 51  ALKPHOS 99 79 90 94  BILITOT 1.1 2.3* 1.6* 1.6*  PROT 7.2 5.9* 6.6 7.1  ALBUMIN 3.9 3.3* 3.8 3.8    Recent Labs Lab 03/03/16 1311 03/06/16 1024  LIPASE 29 71*   No results for input(s): AMMONIA in the last 168 hours. CBC:  Recent Labs Lab 03/03/16 1311 03/04/16 0726 03/05/16 0551 03/06/16 1024  WBC 10.6* 10.2 7.5 12.3*  NEUTROABS 6.9  --   --   --   HGB 16.7 12.9* 13.4 15.9  HCT 46.4 37.7* 39.9 44.0  MCV 95.3 94.7 95.7 94.6  PLT 485* 337 239 291   Cardiac Enzymes: No results for input(s): CKTOTAL, CKMB, CKMBINDEX, TROPONINI in the last 168 hours. BNP: BNP (last 3 results) No results for input(s): BNP in the last 8760 hours.  ProBNP (last 3 results) No results for input(s): PROBNP in the last 8760 hours.  CBG: No results for input(s): GLUCAP in the last 168 hours.  Time coordinating discharge: 30 minutes  Signed:  Kamryn Messineo  [facility] 03/06/2016, 5:24 PM

## 2016-03-06 NOTE — Discharge Instructions (Signed)
Return to the ED with any concerns including vomiting and not able to keep down liquids, worsening abdominal pain, fever/chills, decreased level of alertness/lethargy, or any other alarming symptoms °

## 2016-03-06 NOTE — ED Notes (Signed)
Per EMS, pt reports epigastric and chest pain. Pt recently admitted to hospital for same sx; dx of pancreatitis. Pt left AMA yesterday but sx got worse. Pt alert x4. Pt received 4mg  OTD zofran without relief.

## 2016-03-06 NOTE — ED Notes (Signed)
Pt tolerated drinking ginger ale.

## 2016-03-06 NOTE — ED Notes (Addendum)
All interventions done by Michael Peters Student Nurse done under direct supervision of Payson Crumby RN.  

## 2016-03-06 NOTE — ED Notes (Signed)
Pt unable to give urine specimen at this time 

## 2016-03-06 NOTE — ED Provider Notes (Signed)
CSN: [id number]     Arrival date & time 03/06/16  4098 History   First MD Initiated Contact with Patient 03/06/16 0945     Chief Complaint  Patient presents with  . Chest Pain  . Abdominal Pain     (Consider location/radiation/quality/duration/timing/severity/associated sxs/prior Treatment) HPI  Pt with hx of alcoholic pancreatitis presents with abdominal pain, vomiting, burning in chest.  He was admitted to [facility] and left AMA yesterday- had been receiving IV fluids and dilaudid PCA.  He states that after leaving his symptoms got worse to the point of being unmanageable.  No fever/chills.  Emesis is nonbloody and nonbilious.  No diarrhea.  No difficulty breathing.  He denies having been drinking alcohol since leaving the hospital yesterday.  There are no other associated systemic symptoms, there are no other alleviating or modifying factors.   Past Medical History  Diagnosis Date  . Alcohol abuse   . HIV (human immunodeficiency virus infection) (HCC)     undetectable 2 months ago, CD 4 unknown   . Pancreatitis, alcoholic   . Reactive airway disease   . Gastritis   . Overdose   . Tobacco use   . Hypertension    Past Surgical History  Procedure Laterality Date  . Frenulectomy, upper labial    . Appendectomy    . Cervical disc surgery      twice  . Cholecystectomy     Family History  Problem Relation Age of Onset  . COPD Mother   . Other Father     From being shot.  . Diabetes Mellitus II Father    Social History  Substance Use Topics  . Smoking status: Current Every Day Smoker -- 1.00 packs/day for 30 years    Types: Cigarettes  . Smokeless tobacco: Never Used  . Alcohol Use: 0.0 oz/week    0 Standard drinks or equivalent per week     Comment: fifth liquor per day     Review of Systems  ROS reviewed and all otherwise negative except for mentioned in HPI    Allergies  Ondansetron  Home Medications   Prior to Admission medications   Medication Sig Start Date  End Date Taking? Authorizing Provider  albuterol (PROVENTIL HFA;VENTOLIN HFA) 108 (90 BASE) MCG/ACT inhaler Inhale 2 puffs into the lungs every 6 (six) hours as needed for wheezing or shortness of breath.   Yes Historical Provider, MD  Ascorbic Acid (VITAMIN C) 1000 MG tablet Take 1,000 mg by mouth daily. 02/14/16 02/13/17 Yes Historical Provider, MD  Cyanocobalamin (VITAMIN B-12 CR PO) Take 1 tablet by mouth daily.   Yes Historical Provider, MD  Dolutegravir Sodium (TIVICAY) 25 MG TABS Take 25 mg by mouth at bedtime.   Yes Historical Provider, MD  emtricitabine-tenofovir AF (DESCOVY) 200-25 MG tablet Take 1 tablet by mouth at bedtime. 07/05/15  Yes Historical Provider, MD  folic acid (FOLVITE) 1 MG tablet Take 1 mg by mouth daily. 02/14/16 02/13/17 Yes Historical Provider, MD  lisinopril (PRINIVIL,ZESTRIL) 10 MG tablet Take 10 mg by mouth daily.   Yes Historical Provider, MD  Multiple Vitamin (MULTIVITAMIN WITH MINERALS) TABS tablet Take 1 tablet by mouth daily.    Historical Provider, MD  promethazine (PHENERGAN) 25 MG suppository Place 1 suppository (25 mg total) rectally every 6 (six) hours as needed for nausea or vomiting. 03/06/16   Jerelyn Scott, MD  promethazine (PHENERGAN) 25 MG tablet Take 1 tablet (25 mg total) by mouth every 6 (six) hours as needed for nausea or  vomiting. 03/06/16   Jerelyn Scott, MD  thiamine 100 MG tablet Take 100 mg by mouth daily. 02/14/16 02/13/17  Historical Provider, MD   BP 106/71 mmHg  Pulse 78  Temp(Src) 98.6 F (37 C) (Oral)  Resp 10  SpO2 93%  Vitals reviewed Physical Exam  Physical Examination: General appearance - alert, uncomfortable appearing, and in no distress Mental status - alert, oriented to person, place, and time Eyes - no conjunctival injection, no scleral icterus Mouth - mucous membranes moist, pharynx normal without lesions Chest - clear to auscultation, no wheezes, rales or rhonchi, symmetric air entry Heart - normal rate, regular rhythm, normal  S1, S2, no murmurs, rubs, clicks or gallops Abdomen - soft, mild ttp in epigastric region, no gaurding or rebound tenderness, nondistended, no masses or organomegaly Neurological - alert, oriented, normal speech, no focal findings or movement disorder noted Extremities - peripheral pulses normal, no pedal edema, no clubbing or cyanosis Skin - normal coloration and turgor, no rashes  ED Course  Procedures (including critical care time) Labs Review Labs Reviewed  CBC - Abnormal; Notable for the following:    WBC 12.3 (*)    MCH 34.2 (*)    MCHC 36.1 (*)    All other components within normal limits  COMPREHENSIVE METABOLIC PANEL - Abnormal; Notable for the following:    Potassium 3.3 (*)    Chloride 97 (*)    Glucose, Bld 112 (*)    BUN <5 (*)    AST 74 (*)    Total Bilirubin 1.6 (*)    All other components within normal limits  LIPASE, BLOOD - Abnormal; Notable for the following:    Lipase 71 (*)    All other components within normal limits  URINALYSIS, ROUTINE W REFLEX MICROSCOPIC (NOT AT [facility]) - Abnormal; Notable for the following:    Hgb urine dipstick SMALL (*)    All other components within normal limits  URINE MICROSCOPIC-ADD ON - Abnormal; Notable for the following:    Bacteria, UA RARE (*)    All other components within normal limits  ETHANOL  I-STAT TROPOININ, ED    Imaging Review Ct Abdomen Pelvis W Contrast  03/06/2016  CLINICAL DATA:  Diffuse abdominal pain starting Wednesday, nausea, vomiting, diarrhea EXAM: CT ABDOMEN AND PELVIS WITH CONTRAST TECHNIQUE: Multidetector CT imaging of the abdomen and pelvis was performed using the standard protocol following bolus administration of intravenous contrast. CONTRAST:  80mL ISOVUE-300 IOPAMIDOL (ISOVUE-300) INJECTION 61% COMPARISON:  03/28/2014 FINDINGS: Lower chest: Again noted emphysematous changes lung bases. Stable atelectasis or scarring bilateral lower lobe posteriorly left greater than right. Hepatobiliary: No focal  hepatic mass. There is mild intrahepatic biliary ductal dilatation probable postcholecystectomy. Pancreas: Again noted multiple pancreatic calcifications consistent with chronic calcific pancreatitis. There is interval dilatation of main pancreatic duct up to 6 mm. No definite focal pancreatic mass is noted. Spleen: Enhanced spleen is unremarkable. Adrenals/Urinary Tract: No adrenal gland mass is noted. Enhanced kidneys are symmetrical in size. No hydronephrosis or hydroureter. There is a cyst in midpole lateral aspect of the left kidney measures 1.4 cm. Delayed renal images shows bilateral renal symmetrical excretion. Bilateral visualized proximal ureter is unremarkable. The urinary bladder is unremarkable. Bilateral distal ureter is unremarkable. Stomach/Bowel: No gastric outlet obstruction. No small bowel obstruction. No transition point in caliber of small bowel. There is no pericecal inflammation. Terminal ileum is unremarkable. The patient is status post appendectomy. Some colonic gas and fluid noted within transverse colon. No significant colonic distension. There  is no evidence of acute colitis or diverticulitis. Scattered diverticula are noted descending colon. No distal colonic obstruction. Vascular/Lymphatic: Atherosclerotic calcifications of abdominal aorta and iliac arteries are noted. No evidence of aortic aneurysm. There is no retroperitoneal or mesenteric adenopathy. Reproductive: Prostate gland and seminal vesicles are unremarkable. Other: There is no ascites or free abdominal air. Musculoskeletal: No destructive bony lesions are noted. Sagittal images of the spine shows degenerative changes lumbar spine. Stable Schmorl's node deformities with minimal compression deformity upper endplate T12, L1 and L2 vertebral bodies. IMPRESSION: 1. There is no evidence of acute inflammatory process within abdomen or pelvis. 2. Mild intrahepatic biliary ductal dilatation probable postcholecystectomy. 3. Again  noted multiple pancreatic calcifications consistent with chronic calcific pancreatitis. There is interval dilatation of main pancreatic duct up to 6 mm. Ductal stricture in pancreatic head region cannot be excluded. Clinical correlation is necessary. No focal pancreatic mass is noted. 4. No hydronephrosis or hydroureter. There is a cyst in midpole of the left kidney laterally measures 1.4 cm. 5. No small bowel obstruction. 6. No pericecal inflammation.  Status post appendectomy. 7. Few diverticula are noted descending colon. No evidence of acute diverticulitis. No evidence of acute colitis. 8. Stable degenerative changes lumbar spine. Electronically Signed   By: Natasha MeadLiviu  Pop M.D.   On: 03/06/2016 13:49   Dg Abd Acute W/chest  03/06/2016  CLINICAL DATA:  Pt is vomiting blood, has all over posterior abdomin pain, and anterior chest pain. On and off cough and diarrhea x 6 days. No known fever Hx of HTN- has not taken medication in a while, pt's gallbladder has been removed, and kidney stones, smoker EXAM: DG ABDOMEN ACUTE W/ 1V CHEST COMPARISON:  Report of 11/24/2013, images not available. FINDINGS: Frontal view of the chest demonstrates low cervical spine fixation. Midline trachea. Normal heart size and mediastinal contours for age. No pleural effusion or pneumothorax. Bibasilar scarring or atelectasis. Abdominal films demonstrate no free intraperitoneal air on upright positioning. Scattered air-fluid levels within left abdominal small bowel loops. Gas-filled small bowel loops measuring up to 2.6 cm. Distal gas identified. IMPRESSION: Nonspecific bowel gas pattern, with scattered small bowel air-fluid levels in the left side of the abdomen, but no significant gaseous distention of bowel loops. question mild adynamic ileus, as can be seen with gastroenteritis. No acute cardiopulmonary disease. Electronically Signed   By: Jeronimo GreavesKyle  Talbot M.D.   On: 03/06/2016 12:05   I have personally reviewed and evaluated these images  and lab results as part of my medical decision-making.   EKG Interpretation   Date/Time:  Tuesday March 06 2016 09:48:26 EDT Ventricular Rate:  95 PR Interval:  137 QRS Duration: 84 QT Interval:  372 QTC Calculation: 468 R Axis:   35 Text Interpretation:  Sinus rhythm RSR' in V1 or V2, probably normal  variant Borderline T wave abnormalities No significant change since last  tracing Confirmed by [facility]  MD, Aubrina Nieman [phone]([id number]) on 03/06/2016 10:35:24 AM      MDM   Final diagnoses:  Chronic pancreatitis, unspecified pancreatitis type (HCC)  Non-intractable vomiting with nausea, vomiting of unspecified type    Pt presenting with c/o abdominal pain, vomiting c/w with his prior alcoholic pancreatitis.  Today his labs have improved from recent admission labs.  He feels improved after pain, nausea meds and IV fluids in the ED.  At this point I do not feel he needs re-admission.  Will discharge with po meds for home use.  Pt discharged with strict return precautions.  Mom  agreeable with plan    Jerelyn Scott, MD 03/07/16 1422

## 2016-03-06 NOTE — ED Notes (Signed)
IV attempted x 3 with no success by ED RN.

## 2016-05-26 DEATH — deceased

## 2017-06-12 IMAGING — DX DG ABDOMEN ACUTE W/ 1V CHEST
3 series · 3 of 3 positions shown · non-contrast
Comparison: Report of 11/24/2013, images not available.

CLINICAL DATA: Pt is vomiting blood, has all over posterior abdomin
pain, and anterior chest pain. On and off cough and diarrhea x 6
days. No known fever Hx of HTN- has not taken medication in a while,
pt's gallbladder has been removed, and kidney stones, smoker

EXAM:
DG ABDOMEN ACUTE W/ 1V CHEST

[w chest pa]
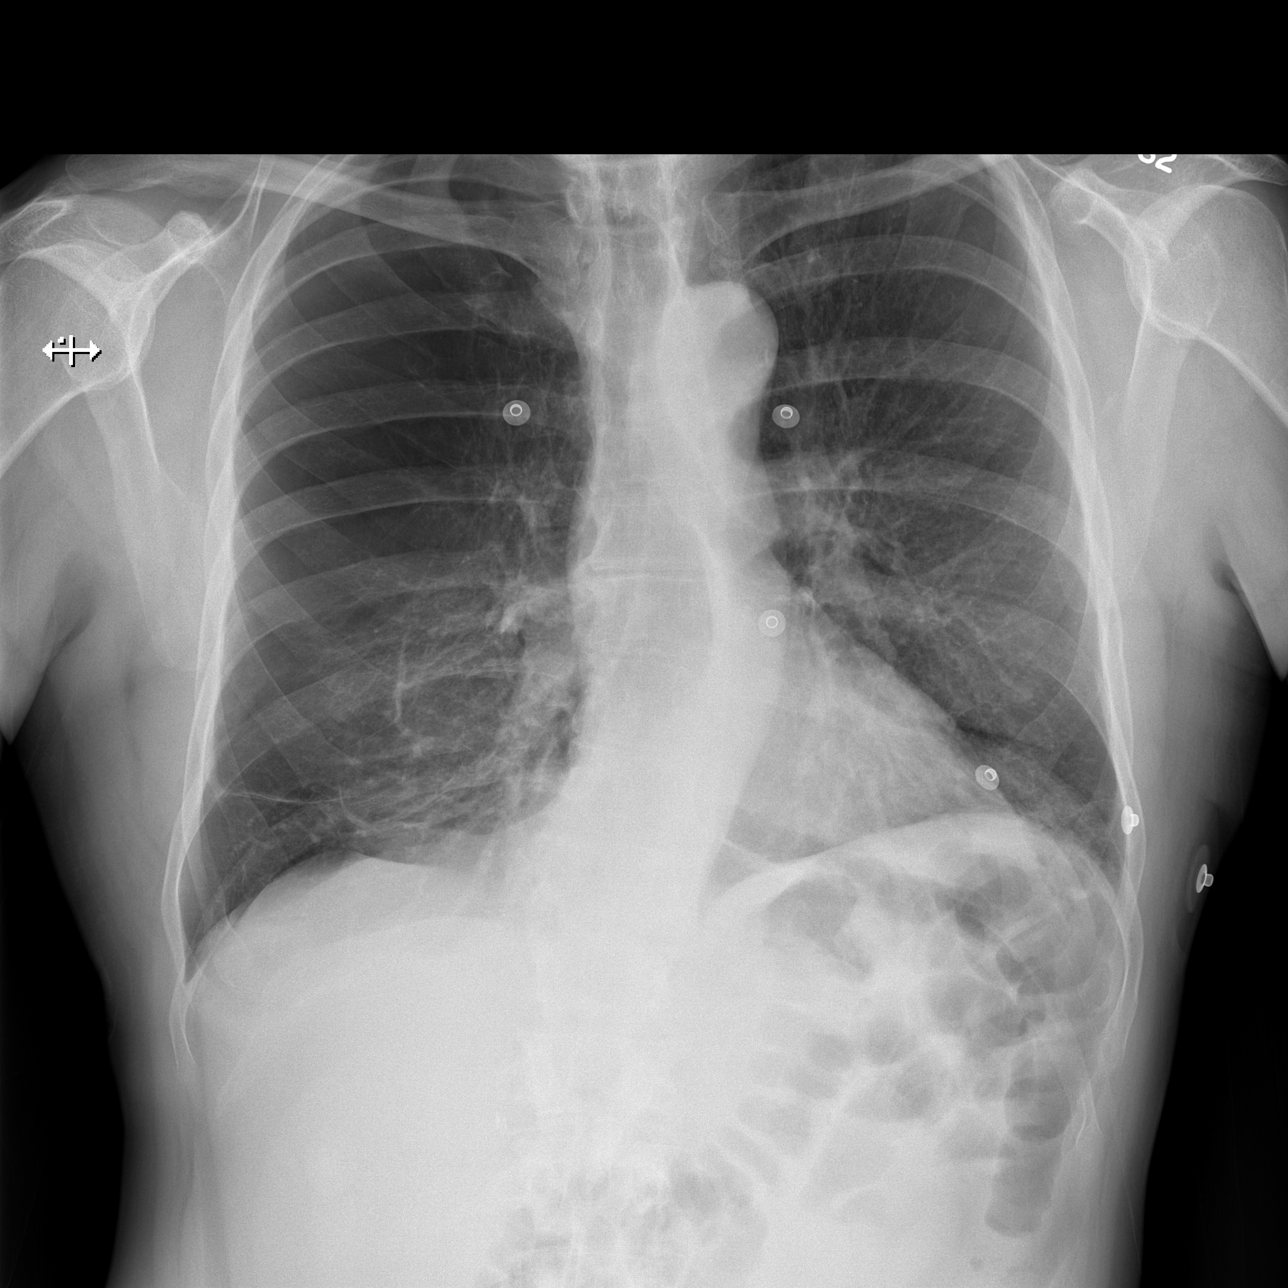

[w abdomen upright]
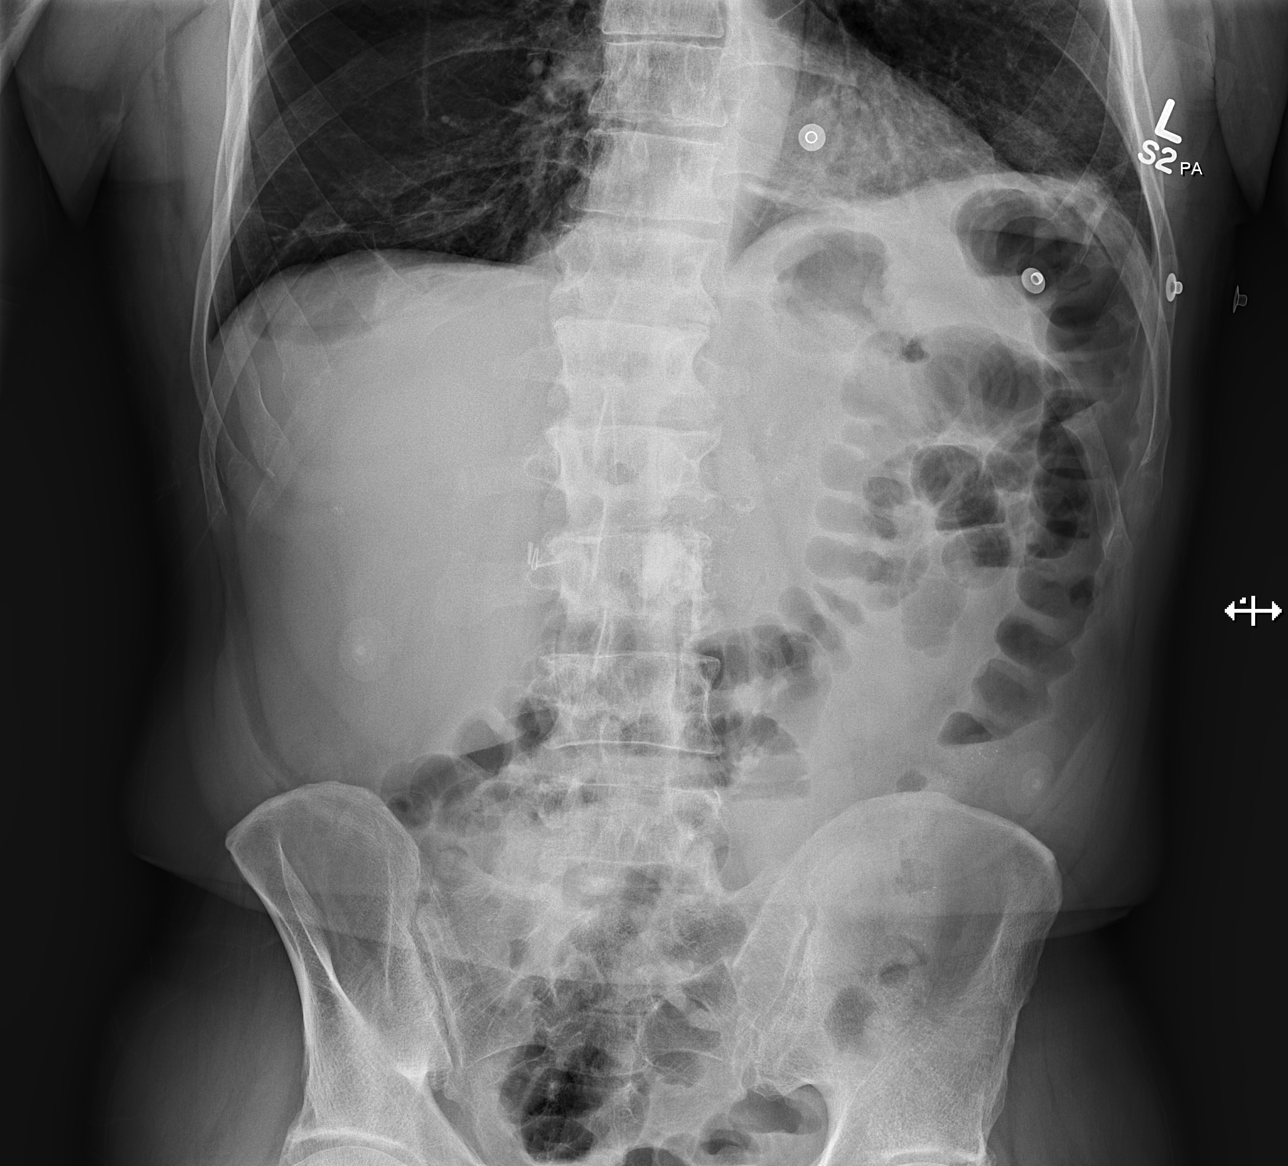

[t abdomen supine]
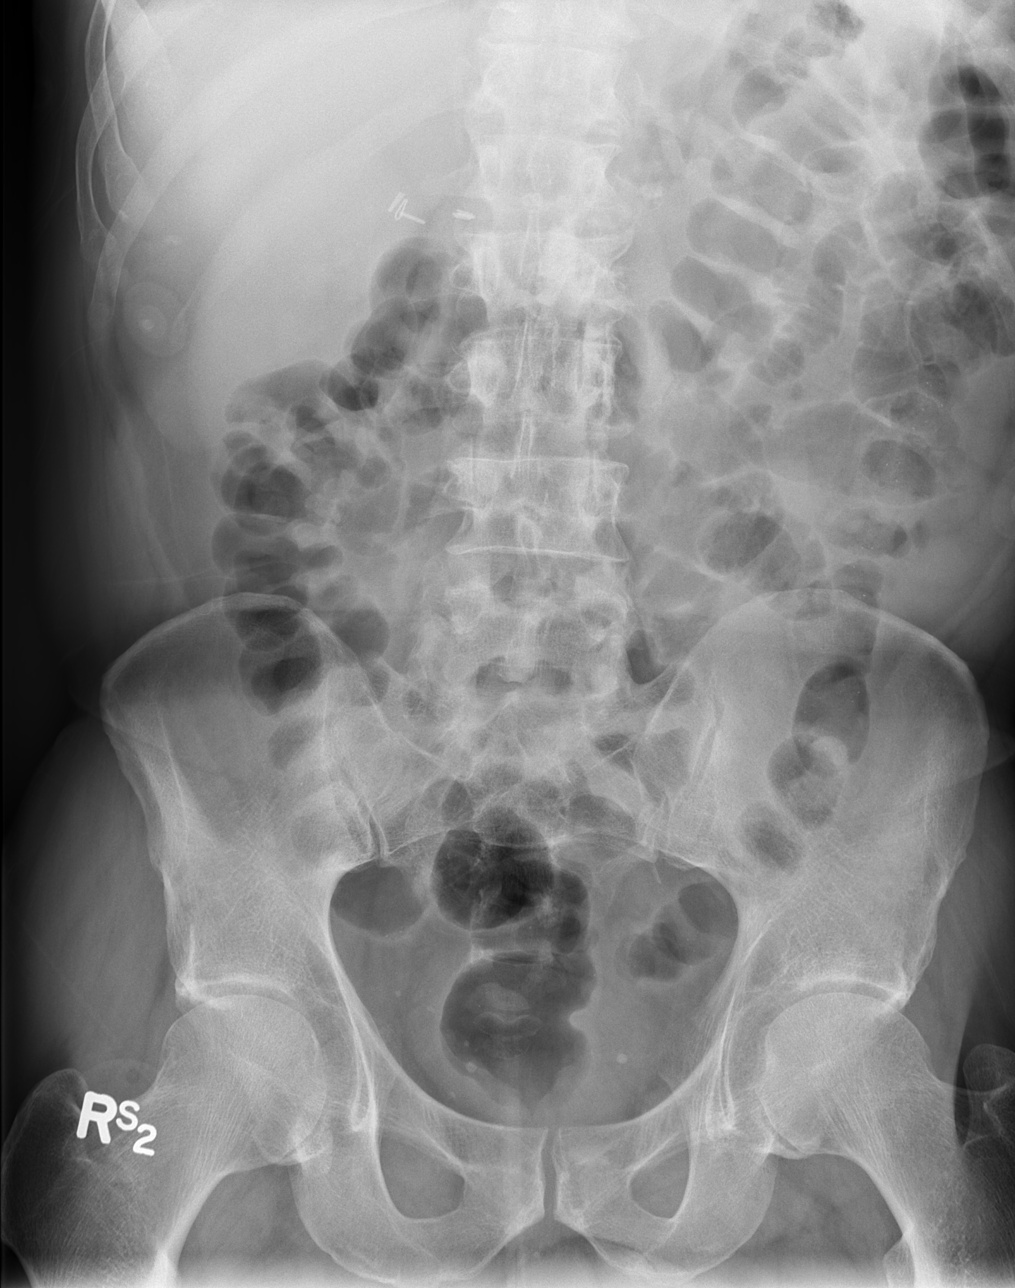

[3 of 3 positions shown; findings below may reference images not displayed]

FINDINGS: Frontal view of the chest demonstrates low cervical spine fixation.
Midline trachea. Normal heart size and mediastinal contours for age.
No pleural effusion or pneumothorax. Bibasilar scarring or
atelectasis.

Abdominal films demonstrate no free intraperitoneal air on upright
positioning. Scattered air-fluid levels within left abdominal small
bowel loops. Gas-filled small bowel loops measuring up to 2.6 cm.
Distal gas identified.
IMPRESSION: Nonspecific bowel gas pattern, with scattered small bowel air-fluid
levels in the left side of the abdomen, but no significant gaseous
distention of bowel loops. question mild adynamic ileus, as can be
seen with gastroenteritis.

No acute cardiopulmonary disease.

## 2017-06-12 IMAGING — CT CT ABD-PELV W/ CM
2 of 5 series · 10 of 46 positions shown, 11 images · IV contrast (iopamidol)
Comparison: 03/28/2014

CLINICAL DATA: Diffuse abdominal pain starting [REDACTED], nausea,
vomiting, diarrhea

EXAM:
CT ABDOMEN AND PELVIS WITH CONTRAST
TECHNIQUE: Multidetector CT imaging of the abdomen and pelvis was performed
using the standard protocol following bolus administration of
intravenous contrast.
CONTRAST:  80mL [id number] IOPAMIDOL ([id number]) INJECTION 61%

[Series 201: routine, idose (2) · axial · 0.60mm/px · z∈[+105,+430]mm · 7 of 86 slices shown, 8 images]
[im 11/86  soft-tissue]
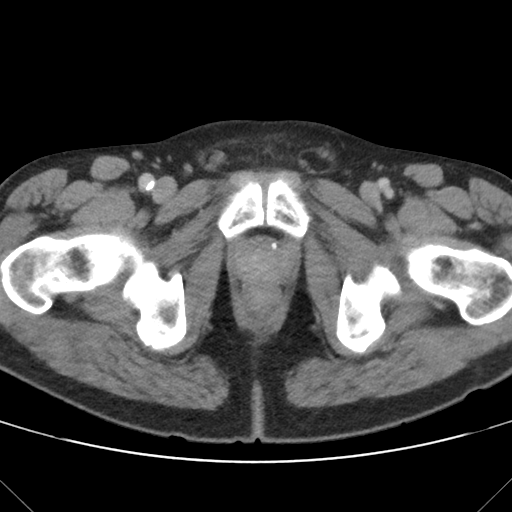
[im 11/86  bone]
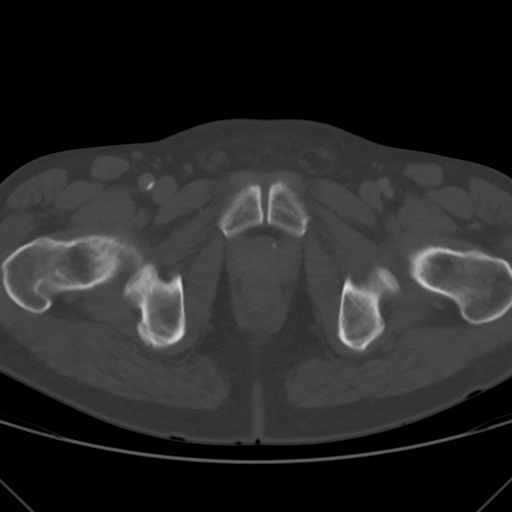
[im 21/86  soft-tissue]
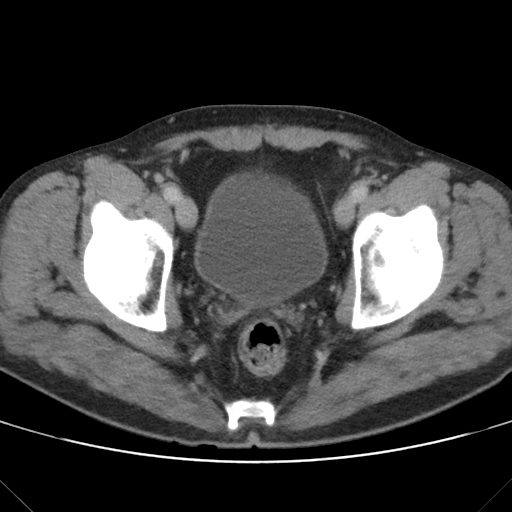
[im 31/86  soft-tissue]
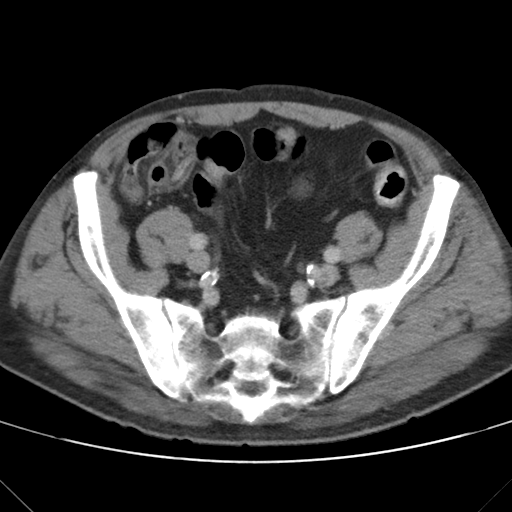
[im 46/86  soft-tissue]
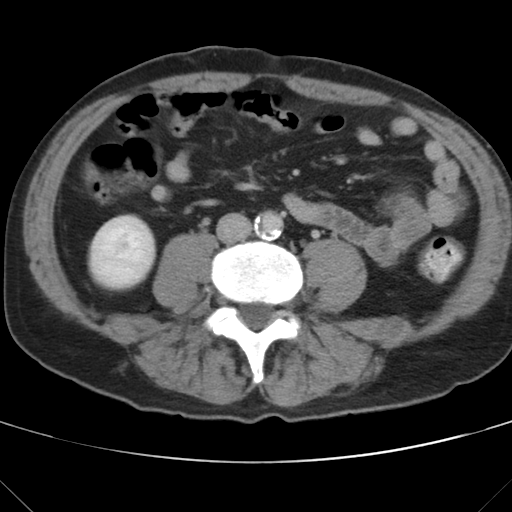
[im 56/86  soft-tissue]
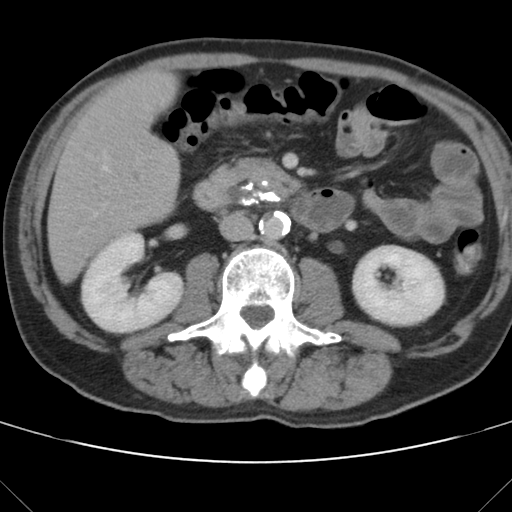
[im 66/86  soft-tissue]
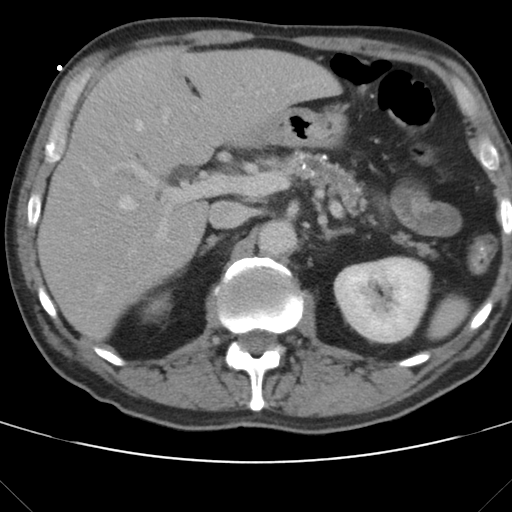
[im 76/86  soft-tissue]
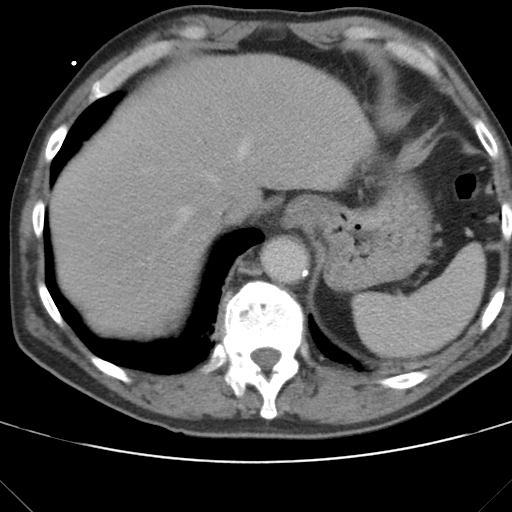

[Series 203: coronals, idose (2) · coronal · 0.45mm/px · 3 of 140 slices shown]
[im 47/140  soft-tissue]
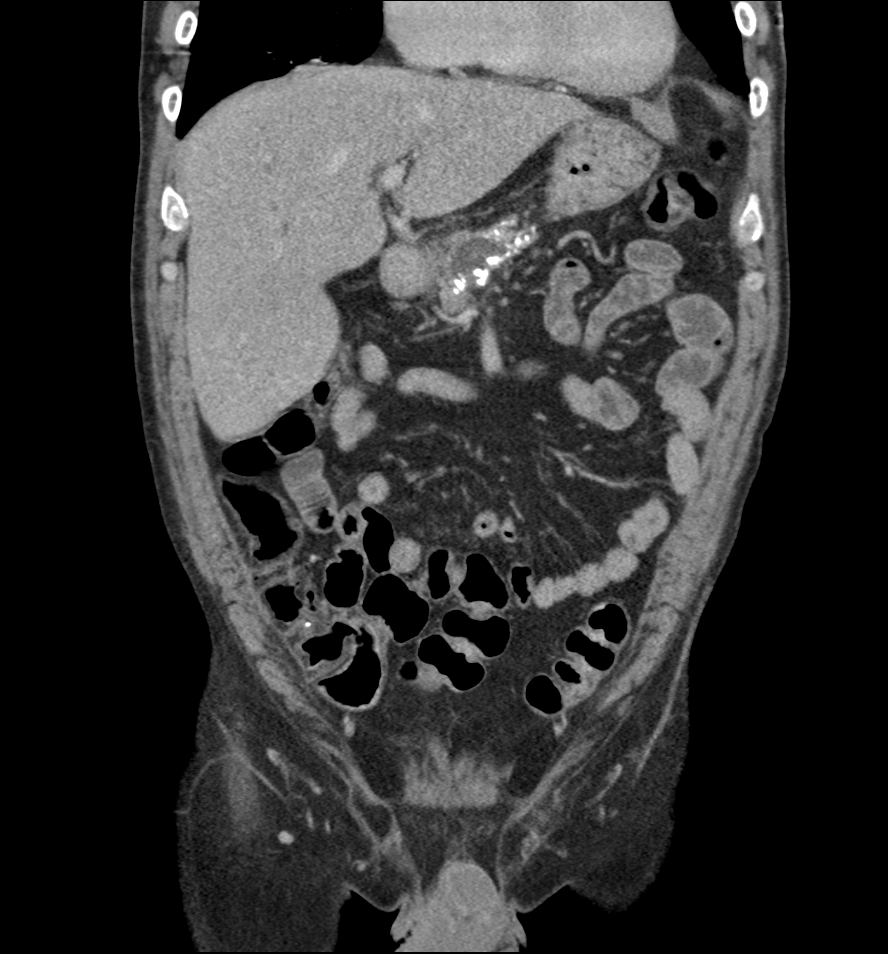
[im 62/140  soft-tissue]
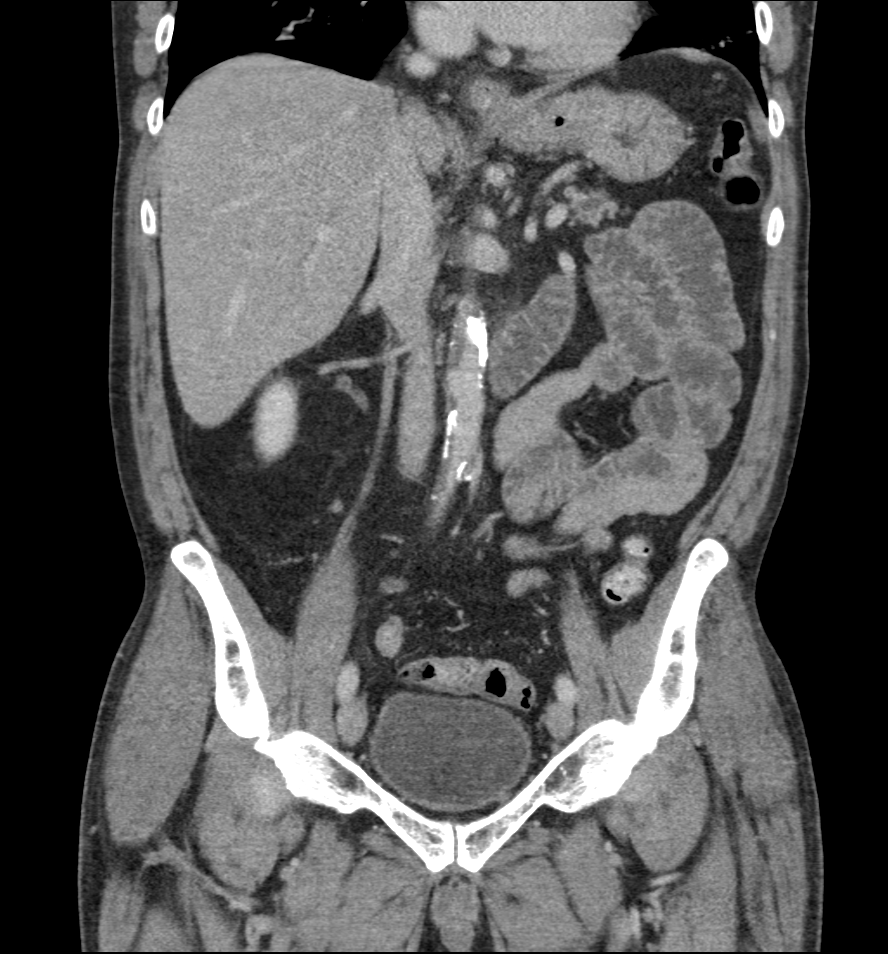
[im 78/140  soft-tissue]
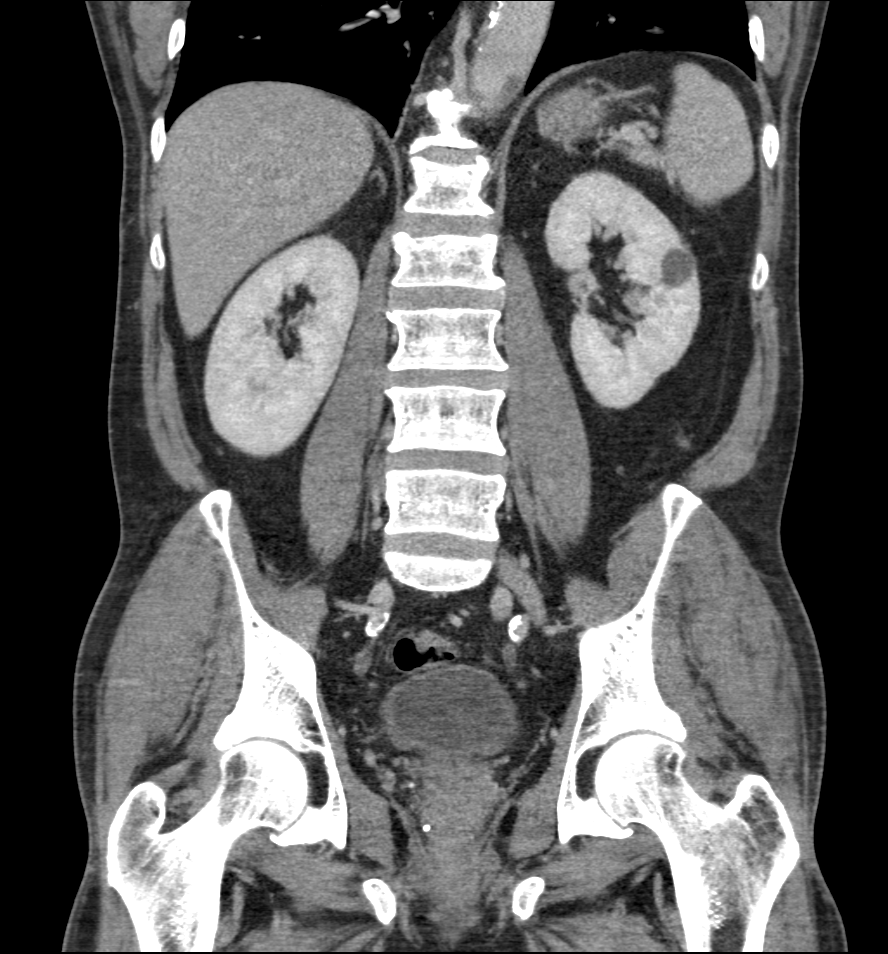

[10 of 46 positions shown; findings below may reference images not displayed]

FINDINGS: Lower chest: Again noted emphysematous changes lung bases. Stable
atelectasis or scarring bilateral lower lobe posteriorly left
greater than right.

Hepatobiliary: No focal hepatic mass. There is mild intrahepatic
biliary ductal dilatation probable postcholecystectomy.

Pancreas: Again noted multiple pancreatic calcifications consistent
with chronic calcific pancreatitis. There is interval dilatation of
main pancreatic duct up to 6 mm. No definite focal pancreatic mass
is noted.

Spleen: Enhanced spleen is unremarkable.

Adrenals/Urinary Tract: No adrenal gland mass is noted. Enhanced
kidneys are symmetrical in size. No hydronephrosis or hydroureter.
There is a cyst in midpole lateral aspect of the left kidney
measures 1.4 cm. Delayed renal images shows bilateral renal
symmetrical excretion. Bilateral visualized proximal ureter is
unremarkable. The urinary bladder is unremarkable. Bilateral distal
ureter is unremarkable.

Stomach/Bowel: No gastric outlet obstruction. No small bowel
obstruction. No transition point in caliber of small bowel.

There is no pericecal inflammation. Terminal ileum is unremarkable.
The patient is status post appendectomy. Some colonic gas and fluid
noted within transverse colon. No significant colonic distension.
There is no evidence of acute colitis or diverticulitis. Scattered
diverticula are noted descending colon. No distal colonic
obstruction.

Vascular/Lymphatic: Atherosclerotic calcifications of abdominal
aorta and iliac arteries are noted. No evidence of aortic aneurysm.
There is no retroperitoneal or mesenteric adenopathy.

Reproductive: Prostate gland and seminal vesicles are unremarkable.

Other: There is no ascites or free abdominal air.

Musculoskeletal: No destructive bony lesions are noted. Sagittal
images of the spine shows degenerative changes lumbar spine. Stable
Schmorl's node deformities with minimal compression deformity upper
endplate T12, L1 and L2 vertebral bodies.
IMPRESSION: 1. There is no evidence of acute inflammatory process within abdomen
or pelvis.
2. Mild intrahepatic biliary ductal dilatation probable
postcholecystectomy.
3. Again noted multiple pancreatic calcifications consistent with
chronic calcific pancreatitis. There is interval dilatation of main
pancreatic duct up to 6 mm. Ductal stricture in pancreatic head
region cannot be excluded. Clinical correlation is necessary. No
focal pancreatic mass is noted.
4. No hydronephrosis or hydroureter. There is a cyst in midpole of
the left kidney laterally measures 1.4 cm.
5. No small bowel obstruction.
6. No pericecal inflammation.  Status post appendectomy.
7. Few diverticula are noted descending colon. No evidence of acute
diverticulitis. No evidence of acute colitis.
8. Stable degenerative changes lumbar spine.
